# Patient Record
Sex: Female | Born: 1935 | Race: Black or African American | Hispanic: No | Marital: Single | Smoking: Former smoker
Health system: Southern US, Community
[De-identification: ages and names within clinical notes are randomized; demographics above are authoritative.]

## PROBLEM LIST (undated history)

## (undated) DIAGNOSIS — E785 Hyperlipidemia, unspecified: Secondary | ICD-10-CM

## (undated) DIAGNOSIS — E669 Obesity, unspecified: Secondary | ICD-10-CM

## (undated) DIAGNOSIS — I10 Essential (primary) hypertension: Secondary | ICD-10-CM

## (undated) DIAGNOSIS — N289 Disorder of kidney and ureter, unspecified: Secondary | ICD-10-CM

## (undated) DIAGNOSIS — D649 Anemia, unspecified: Secondary | ICD-10-CM

## (undated) DIAGNOSIS — K635 Polyp of colon: Secondary | ICD-10-CM

## (undated) DIAGNOSIS — M1990 Unspecified osteoarthritis, unspecified site: Secondary | ICD-10-CM

## (undated) HISTORY — DX: Polyp of colon: K63.5

## (undated) HISTORY — DX: Essential (primary) hypertension: I10

## (undated) HISTORY — DX: Hyperlipidemia, unspecified: E78.5

## (undated) HISTORY — DX: Disorder of kidney and ureter, unspecified: N28.9

## (undated) HISTORY — PX: THYROIDECTOMY: SHX17

## (undated) HISTORY — PX: EYE SURGERY: SHX253

## (undated) HISTORY — DX: Unspecified osteoarthritis, unspecified site: M19.90

## (undated) HISTORY — DX: Obesity, unspecified: E66.9

## (undated) HISTORY — PX: NEPHRECTOMY: SHX65

## (undated) HISTORY — PX: OTHER SURGICAL HISTORY: SHX169

---

## 1999-09-08 ENCOUNTER — Encounter: Payer: Self-pay | Admitting: Internal Medicine

## 1999-09-08 ENCOUNTER — Encounter: Admission: RE | Admit: 1999-09-08 | Discharge: 1999-09-08 | Payer: Self-pay | Admitting: Internal Medicine

## 2001-09-17 ENCOUNTER — Encounter: Admission: RE | Admit: 2001-09-17 | Discharge: 2001-09-17 | Payer: Self-pay | Admitting: Internal Medicine

## 2001-09-17 ENCOUNTER — Encounter: Payer: Self-pay | Admitting: Internal Medicine

## 2004-09-09 ENCOUNTER — Encounter: Admission: RE | Admit: 2004-09-09 | Discharge: 2004-12-08 | Payer: Self-pay | Admitting: Family Medicine

## 2004-09-15 LAB — HM COLONOSCOPY

## 2004-09-22 ENCOUNTER — Encounter: Admission: RE | Admit: 2004-09-22 | Discharge: 2004-09-22 | Payer: Self-pay | Admitting: Family Medicine

## 2005-12-15 ENCOUNTER — Encounter: Admission: RE | Admit: 2005-12-15 | Discharge: 2005-12-15 | Payer: Self-pay | Admitting: Family Medicine

## 2005-12-22 ENCOUNTER — Ambulatory Visit: Payer: Self-pay | Admitting: Family Medicine

## 2006-02-21 ENCOUNTER — Ambulatory Visit: Payer: Self-pay | Admitting: Family Medicine

## 2006-03-14 ENCOUNTER — Ambulatory Visit: Payer: Self-pay | Admitting: Family Medicine

## 2006-03-27 ENCOUNTER — Ambulatory Visit: Payer: Self-pay | Admitting: Family Medicine

## 2006-07-20 ENCOUNTER — Ambulatory Visit: Payer: Self-pay | Admitting: Family Medicine

## 2006-08-15 ENCOUNTER — Ambulatory Visit: Payer: Self-pay | Admitting: Family Medicine

## 2006-12-05 ENCOUNTER — Ambulatory Visit: Payer: Self-pay | Admitting: Family Medicine

## 2007-01-04 ENCOUNTER — Ambulatory Visit: Payer: Self-pay | Admitting: Family Medicine

## 2007-05-10 ENCOUNTER — Ambulatory Visit: Payer: Self-pay | Admitting: Family Medicine

## 2007-09-03 ENCOUNTER — Ambulatory Visit: Payer: Self-pay | Admitting: Family Medicine

## 2007-09-04 ENCOUNTER — Encounter: Admission: RE | Admit: 2007-09-04 | Discharge: 2007-09-04 | Payer: Self-pay | Admitting: Family Medicine

## 2007-12-13 ENCOUNTER — Ambulatory Visit: Payer: Self-pay | Admitting: Family Medicine

## 2008-02-26 ENCOUNTER — Ambulatory Visit: Payer: Self-pay | Admitting: Family Medicine

## 2008-06-18 ENCOUNTER — Ambulatory Visit: Payer: Self-pay | Admitting: Family Medicine

## 2008-08-08 ENCOUNTER — Ambulatory Visit: Payer: Self-pay | Admitting: Family Medicine

## 2008-09-25 ENCOUNTER — Ambulatory Visit: Payer: Self-pay | Admitting: Family Medicine

## 2008-10-15 ENCOUNTER — Ambulatory Visit: Payer: Self-pay | Admitting: Family Medicine

## 2008-11-25 ENCOUNTER — Ambulatory Visit: Payer: Self-pay | Admitting: Family Medicine

## 2008-11-28 ENCOUNTER — Ambulatory Visit: Payer: Self-pay | Admitting: Family Medicine

## 2009-01-14 ENCOUNTER — Ambulatory Visit: Payer: Self-pay | Admitting: Family Medicine

## 2009-01-20 ENCOUNTER — Ambulatory Visit: Payer: Self-pay | Admitting: Family Medicine

## 2009-05-20 ENCOUNTER — Ambulatory Visit: Payer: Self-pay | Admitting: Family Medicine

## 2009-07-07 ENCOUNTER — Ambulatory Visit: Payer: Self-pay | Admitting: Family Medicine

## 2009-07-09 ENCOUNTER — Ambulatory Visit: Payer: Self-pay | Admitting: Family Medicine

## 2009-11-11 ENCOUNTER — Ambulatory Visit: Payer: Self-pay | Admitting: Family Medicine

## 2009-11-17 ENCOUNTER — Ambulatory Visit: Payer: Self-pay | Admitting: Family Medicine

## 2010-02-10 ENCOUNTER — Ambulatory Visit: Payer: Self-pay | Admitting: Family Medicine

## 2010-05-25 ENCOUNTER — Emergency Department (HOSPITAL_COMMUNITY)
Admission: EM | Admit: 2010-05-25 | Discharge: 2010-05-26 | Disposition: A | Discharge status: Home / Self Care | Payer: Medicare Other | Attending: Emergency Medicine | Admitting: Emergency Medicine

## 2010-05-25 DIAGNOSIS — I10 Essential (primary) hypertension: Secondary | ICD-10-CM | POA: Insufficient documentation

## 2010-05-25 DIAGNOSIS — Z79899 Other long term (current) drug therapy: Secondary | ICD-10-CM | POA: Insufficient documentation

## 2010-05-25 DIAGNOSIS — K5900 Constipation, unspecified: Secondary | ICD-10-CM | POA: Insufficient documentation

## 2010-05-25 DIAGNOSIS — M109 Gout, unspecified: Secondary | ICD-10-CM | POA: Insufficient documentation

## 2010-05-25 DIAGNOSIS — E119 Type 2 diabetes mellitus without complications: Secondary | ICD-10-CM | POA: Insufficient documentation

## 2010-05-25 DIAGNOSIS — Z7982 Long term (current) use of aspirin: Secondary | ICD-10-CM | POA: Insufficient documentation

## 2010-05-26 ENCOUNTER — Emergency Department (HOSPITAL_COMMUNITY): Payer: Medicare Other

## 2010-06-18 ENCOUNTER — Ambulatory Visit (INDEPENDENT_AMBULATORY_CARE_PROVIDER_SITE_OTHER): Payer: Medicare Other | Admitting: Family Medicine

## 2010-06-18 DIAGNOSIS — E119 Type 2 diabetes mellitus without complications: Secondary | ICD-10-CM

## 2010-06-18 DIAGNOSIS — M069 Rheumatoid arthritis, unspecified: Secondary | ICD-10-CM

## 2010-06-18 DIAGNOSIS — I10 Essential (primary) hypertension: Secondary | ICD-10-CM

## 2010-06-18 DIAGNOSIS — M129 Arthropathy, unspecified: Secondary | ICD-10-CM

## 2010-11-30 ENCOUNTER — Encounter: Payer: Self-pay | Admitting: Family Medicine

## 2010-12-01 ENCOUNTER — Ambulatory Visit (INDEPENDENT_AMBULATORY_CARE_PROVIDER_SITE_OTHER): Payer: Medicare Other | Admitting: Family Medicine

## 2010-12-01 ENCOUNTER — Encounter: Payer: Self-pay | Admitting: Family Medicine

## 2010-12-01 DIAGNOSIS — M069 Rheumatoid arthritis, unspecified: Secondary | ICD-10-CM

## 2010-12-01 DIAGNOSIS — E785 Hyperlipidemia, unspecified: Secondary | ICD-10-CM

## 2010-12-01 DIAGNOSIS — E119 Type 2 diabetes mellitus without complications: Secondary | ICD-10-CM | POA: Insufficient documentation

## 2010-12-01 DIAGNOSIS — Z23 Encounter for immunization: Secondary | ICD-10-CM

## 2010-12-01 LAB — LIPID PANEL
HDL: 75 mg/dL (ref >39)
LDL Cholesterol: 82 mg/dL (ref 0–99)
Triglycerides: 53 mg/dL (ref <150)
VLDL: 11 mg/dL (ref 0–40)

## 2010-12-01 NOTE — Progress Notes (Signed)
  Subjective:    Patient ID: Joanne Valdez, female    DOB: 1926-09-06, 77 y.o.   MRN: [id number]  HPI She is here for a recheck. She is scheduled to see an orthopedic surgeon in the near future for possible left knee replacement. Her rheumatoid arthritis is under fairly good control. She has seen some low blood sugars in the morning taking the metformin twice per day. She has had cataract surgery within the last year and is doing much better with this. She has had no gout attacks. She has difficulty with walking and exercising due to the knee pain. She does not smoke or drink.   Review of Systems     Objective:   Physical Exam Alert and in no distress. Hemoglobin A1c is 5.5.      Assessment & Plan:  Diabetes. Rheumatoid arthritis. Obesity. Hyperlipidemia. Hypertension. Gout. Cut back on metformin to one pill per day. She will followup with her orthopedic surgeon. Continue on her other medications. Recheck here in 4 months.

## 2010-12-01 NOTE — Patient Instructions (Signed)
Take the metformin once per day

## 2010-12-02 ENCOUNTER — Telehealth: Payer: Self-pay

## 2010-12-02 NOTE — Telephone Encounter (Signed)
Pt informed of labs and will call when she needs med refilled

## 2010-12-08 ENCOUNTER — Telehealth: Payer: Self-pay | Admitting: Family Medicine

## 2010-12-08 MED ORDER — LISINOPRIL-HYDROCHLOROTHIAZIDE 20-12.5 MG PO TABS: 1.0000 | ORAL_TABLET | Freq: Every day | ORAL | Status: DC

## 2010-12-08 NOTE — Telephone Encounter (Signed)
Sent 30 days to [facility] and 90 days to [facility] per pt call lisinnopril

## 2011-01-06 ENCOUNTER — Other Ambulatory Visit: Payer: Self-pay | Admitting: Family Medicine

## 2011-03-30 DIAGNOSIS — H43399 Other vitreous opacities, unspecified eye: Secondary | ICD-10-CM | POA: Diagnosis not present

## 2011-03-30 DIAGNOSIS — H26499 Other secondary cataract, unspecified eye: Secondary | ICD-10-CM | POA: Diagnosis not present

## 2011-03-30 DIAGNOSIS — E109 Type 1 diabetes mellitus without complications: Secondary | ICD-10-CM | POA: Diagnosis not present

## 2011-03-30 DIAGNOSIS — H40159 Residual stage of open-angle glaucoma, unspecified eye: Secondary | ICD-10-CM | POA: Diagnosis not present

## 2011-04-04 ENCOUNTER — Emergency Department (HOSPITAL_COMMUNITY): Payer: Medicare Other

## 2011-04-04 ENCOUNTER — Encounter (HOSPITAL_COMMUNITY): Payer: Self-pay | Admitting: Emergency Medicine

## 2011-04-04 ENCOUNTER — Emergency Department (HOSPITAL_COMMUNITY)
Admission: EM | Admit: 2011-04-04 | Discharge: 2011-04-04 | Disposition: A | Discharge status: Home / Self Care | Payer: Medicare Other | Attending: Emergency Medicine | Admitting: Emergency Medicine

## 2011-04-04 DIAGNOSIS — E785 Hyperlipidemia, unspecified: Secondary | ICD-10-CM | POA: Insufficient documentation

## 2011-04-04 DIAGNOSIS — IMO0002 Reserved for concepts with insufficient information to code with codable children: Secondary | ICD-10-CM | POA: Diagnosis not present

## 2011-04-04 DIAGNOSIS — Z79899 Other long term (current) drug therapy: Secondary | ICD-10-CM | POA: Diagnosis not present

## 2011-04-04 DIAGNOSIS — Z7982 Long term (current) use of aspirin: Secondary | ICD-10-CM | POA: Insufficient documentation

## 2011-04-04 DIAGNOSIS — T148XXA Other injury of unspecified body region, initial encounter: Secondary | ICD-10-CM | POA: Diagnosis not present

## 2011-04-04 DIAGNOSIS — E119 Type 2 diabetes mellitus without complications: Secondary | ICD-10-CM | POA: Insufficient documentation

## 2011-04-04 DIAGNOSIS — M129 Arthropathy, unspecified: Secondary | ICD-10-CM | POA: Diagnosis not present

## 2011-04-04 DIAGNOSIS — M25569 Pain in unspecified knee: Secondary | ICD-10-CM | POA: Insufficient documentation

## 2011-04-04 DIAGNOSIS — M1990 Unspecified osteoarthritis, unspecified site: Secondary | ICD-10-CM

## 2011-04-04 DIAGNOSIS — Z8639 Personal history of other endocrine, nutritional and metabolic disease: Secondary | ICD-10-CM | POA: Insufficient documentation

## 2011-04-04 DIAGNOSIS — I10 Essential (primary) hypertension: Secondary | ICD-10-CM | POA: Diagnosis not present

## 2011-04-04 DIAGNOSIS — I998 Other disorder of circulatory system: Secondary | ICD-10-CM | POA: Insufficient documentation

## 2011-04-04 DIAGNOSIS — M25469 Effusion, unspecified knee: Secondary | ICD-10-CM | POA: Diagnosis not present

## 2011-04-04 DIAGNOSIS — M25869 Other specified joint disorders, unspecified knee: Secondary | ICD-10-CM | POA: Diagnosis not present

## 2011-04-04 DIAGNOSIS — M259 Joint disorder, unspecified: Secondary | ICD-10-CM | POA: Diagnosis not present

## 2011-04-04 DIAGNOSIS — Z862 Personal history of diseases of the blood and blood-forming organs and certain disorders involving the immune mechanism: Secondary | ICD-10-CM | POA: Insufficient documentation

## 2011-04-04 DIAGNOSIS — M1710 Unilateral primary osteoarthritis, unspecified knee: Secondary | ICD-10-CM | POA: Insufficient documentation

## 2011-04-04 MED ORDER — HYDROCODONE-ACETAMINOPHEN 5-325 MG PO TABS: 2.0000 | ORAL_TABLET | ORAL | Status: AC | PRN

## 2011-04-04 MED ORDER — OXYCODONE-ACETAMINOPHEN 5-325 MG PO TABS
1.0000 | ORAL_TABLET | Freq: Once | ORAL | Status: AC
  Administered 2011-04-04: 1 via ORAL
  Filled 2011-04-04: qty 1

## 2011-04-04 NOTE — ED Provider Notes (Signed)
History     CSN: [id number]  Arrival date & time 04/04/11  4098   First MD Initiated Contact with Patient 04/04/11 [phone]      Chief Complaint  Patient presents with  . Knee Pain    (Consider location/radiation/quality/duration/timing/severity/associated sxs/prior treatment) HPI  Pt has a history of severe arthritis in her left knee for which she is scheduled to have a knee replacement in April of 2013. She states that she has some bruising and can't put weight on it. She denies any recent fall or injury that she can remember. She denies having fevers, chills, nausea, vomiting, generalized weakness, or back pain. Otherwise, patient states she "feels pretty good".  Past Medical History  Diagnosis Date  . Diabetes mellitus   . Obesity   . Hypertension   . Gout   . Dyslipidemia   . Renal insufficiency   . Arthritis     RHEUMATOID  . Glaucoma   . Colonic polyp     Past Surgical History  Procedure Date  . Eye surgery     History reviewed. No pertinent family history.  History  Substance Use Topics  . Smoking status: Never Smoker   . Smokeless tobacco: Never Used  . Alcohol Use: Not on file    OB History    Grav Para Term Preterm Abortions TAB SAB Ect Mult Living                  Review of Systems  All other systems reviewed and are negative.    Allergies  Phenobarbital  Home Medications   Current Outpatient Rx  Name Route Sig Dispense Refill  . ALLOPURINOL 300 MG PO TABS Oral Take 300 mg by mouth daily.      Marland Kitchen AMLODIPINE BESYLATE 10 MG PO TABS Oral Take 10 mg by mouth daily.      . ASPIRIN 81 MG PO CHEW Oral Chew 81 mg by mouth daily.      . ATORVASTATIN CALCIUM 40 MG PO TABS Oral Take 40 mg by mouth daily.     Marland Kitchen VITAMIN D3 1000 UNITS PO CAPS Oral Take 1,000 mg by mouth daily.     Marland Kitchen FERROUS SULFATE 325 (65 FE) MG PO TABS Oral Take 325 mg by mouth daily with breakfast.      . FOLIC ACID 1 MG PO TABS Oral Take 1 mg by mouth daily.     Marland Kitchen  LISINOPRIL-HYDROCHLOROTHIAZIDE 20-12.5 MG PO TABS Oral Take 1 tablet by mouth daily. 90 tablet 1  . METFORMIN HCL ER (MOD) 500 MG PO TB24 Oral Take 500 mg by mouth 2 (two) times daily with a meal.     . METHOTREXATE (ANTI-RHEUMATIC) 2.5 MG PO TABS Oral Take 10 mg by mouth once a week. Take on Tuesdays    . ADULT MULTIVITAMIN W/MINERALS CH Oral Take 1 tablet by mouth daily.      Marland Kitchen PREDNISONE 5 MG PO TABS Oral Take 5 mg by mouth daily.     Marland Kitchen HYDROCODONE-ACETAMINOPHEN 5-325 MG PO TABS Oral Take 2 tablets by mouth every 4 (four) hours as needed for pain. 6 tablet 0    BP 95/52  Pulse 115  Temp(Src) 97.7 F (36.5 C) (Oral)  Resp 18  SpO2 100%  Physical Exam  Constitutional: She is oriented to person, place, and time. She appears well-developed and well-nourished.  HENT:  Head: Normocephalic and atraumatic.  Eyes: Conjunctivae are normal. Pupils are equal, round, and reactive to light.  Neck: Trachea  normal, normal range of motion and full passive range of motion without pain. Neck supple.  Cardiovascular: Normal rate and normal pulses.   Pulmonary/Chest: Effort normal. Chest wall is not dull to percussion. She exhibits no tenderness, no crepitus, no edema, no deformity and no retraction.  Abdominal: Soft. Normal appearance.  Musculoskeletal: Normal range of motion.       Legs: Lymphadenopathy:       Head (right side): No submental, no submandibular, no tonsillar, no preauricular, no posterior auricular and no occipital adenopathy present.       Head (left side): No submental, no submandibular, no tonsillar, no preauricular, no posterior auricular and no occipital adenopathy present.    She has no cervical adenopathy.    She has no axillary adenopathy.  Neurological: She is oriented to person, place, and time. She has normal strength.  Skin: Skin is warm, dry and intact.  Psychiatric: Her speech is normal. Cognition and memory are normal.    ED Course  Procedures (including critical  care time)  Labs Reviewed - No data to display Dg Knee Complete 4 Views Left  04/04/2011  *RADIOLOGY REPORT*  Clinical Data: Pain.  Bruising.  LEFT KNEE - COMPLETE 4+ VIEW  Comparison: 01/06/2010  Findings: There is a small joint effusion.  There is tricompartmental osteoarthritis with joint space narrowing and marginal osteophytes.  No evidence of fracture or other acute bony finding.  IMPRESSION: Joint effusion.  Tricompartmental osteoarthritis.  Findings have progressed since the previous study.  Original Report Authenticated By: Thomasenia Sales, M.D.     1. Osteoarthritis       MDM  Pt examined by Doctor Jeraldine Loots and myself. He agrees with management and tx/plan.        Dorthula Matas, PA 04/04/11 1024

## 2011-04-04 NOTE — ED Notes (Signed)
Reports saw ortho MD 2 weeks ago & received an injection with some improvement but pain returned. Denies injury

## 2011-04-04 NOTE — ED Notes (Signed)
Pt c/o left knee pain x 2 days; pt denies obvious injury but sts hx OA in knee; pt sts some swelling

## 2011-04-04 NOTE — ED Provider Notes (Signed)
Medical screening examination/treatment/procedure(s) were conducted as a shared visit with non-physician practitioner(s) and myself.  I personally evaluated the patient during the encounter Elderly F in no distress, though uncomfortable w ROM of L knee.  XR(reviewed by me) c/w djd. The need for expedited f/u w ortho was d/w the patient and her daughter.  Analgesia provided.  Gerhard Munch, MD 04/04/11 [phone]

## 2011-04-08 ENCOUNTER — Ambulatory Visit (INDEPENDENT_AMBULATORY_CARE_PROVIDER_SITE_OTHER): Payer: Medicare Other | Admitting: Family Medicine

## 2011-04-08 ENCOUNTER — Encounter: Payer: Self-pay | Admitting: Family Medicine

## 2011-04-08 DIAGNOSIS — S8000XA Contusion of unspecified knee, initial encounter: Secondary | ICD-10-CM | POA: Diagnosis not present

## 2011-04-08 DIAGNOSIS — E119 Type 2 diabetes mellitus without complications: Secondary | ICD-10-CM

## 2011-04-08 DIAGNOSIS — M069 Rheumatoid arthritis, unspecified: Secondary | ICD-10-CM | POA: Diagnosis not present

## 2011-04-08 DIAGNOSIS — R000 Tachycardia, unspecified: Secondary | ICD-10-CM

## 2011-04-08 DIAGNOSIS — M1710 Unilateral primary osteoarthritis, unspecified knee: Secondary | ICD-10-CM

## 2011-04-08 DIAGNOSIS — S8002XA Contusion of left knee, initial encounter: Secondary | ICD-10-CM

## 2011-04-08 NOTE — Patient Instructions (Signed)
First use Tylenol for pain relief and then add Advil 4 tablets 3 times per day if the Tylenol does not work. If that still does not work, call me.

## 2011-04-08 NOTE — Progress Notes (Signed)
  Subjective:    Patient ID: Joanne Valdez, female    DOB: 09-Oct-1926, 78 y.o.   MRN: [id number]  HPI She is here for a preoperative evaluation. She is scheduled for total left knee replacement. She does have some discoloration to the lateral aspect of her left knee area distally. She does not relate any recent injury. She was seen in the emergency room for this. They did give her Percocet. She continues to complain of knee pain. She has had no chest pain, shortness of breath, cough. She continues on medications listed in the chart. She has no other concerns or complaints.   Review of Systems     Objective:   Physical Exam alert and in no distress. Tympanic membranes and canals are normal. Throat is clear. Tonsils are normal. Neck is supple without adenopathy or thyromegaly. Cardiac exam shows a tachycardia without murmurs or gallops. Lungs are clear to auscultation. EKG shows no acute changes.       Assessment & Plan:   1. Diabetes mellitus  CBC with Differential, Comprehensive metabolic panel  2. Rheumatoid arthritis    3. Degenerative arthritis of knee    4. Tachycardia  PR ELECTROCARDIOGRAM, COMPLETE, Ambulatory referral to Cardiology  5. Contusion of left knee  CBC with Differential, Comprehensive metabolic panel   since she is scheduled for surgery, a cardiology evaluation concerning her tachycardia is appropriate.

## 2011-04-09 LAB — COMPREHENSIVE METABOLIC PANEL
Alkaline Phosphatase: 70 U/L (ref 39–117)
BUN: 40 mg/dL — ABNORMAL HIGH (ref 6–23)
Glucose, Bld: 139 mg/dL — ABNORMAL HIGH (ref 70–99)
Sodium: 137 mEq/L (ref 135–145)
Total Bilirubin: 0.6 mg/dL (ref 0.3–1.2)
Total Protein: 6 g/dL (ref 6.0–8.3)

## 2011-04-09 LAB — CBC WITH DIFFERENTIAL/PLATELET
Basophils Relative: 0 % (ref 0–1)
Eosinophils Absolute: 0.2 10*3/uL (ref 0.0–0.7)
Hemoglobin: 9.6 g/dL — ABNORMAL LOW (ref 12.0–15.0)
MCH: 31.2 pg (ref 26.0–34.0)
MCHC: 31.8 g/dL (ref 30.0–36.0)
Monocytes Relative: 9 % (ref 3–12)
Neutrophils Relative %: 75 % (ref 43–77)

## 2011-04-11 ENCOUNTER — Other Ambulatory Visit: Payer: Self-pay | Admitting: Family Medicine

## 2011-04-14 ENCOUNTER — Ambulatory Visit (INDEPENDENT_AMBULATORY_CARE_PROVIDER_SITE_OTHER): Payer: Medicare Other | Admitting: Family Medicine

## 2011-04-14 ENCOUNTER — Encounter: Payer: Self-pay | Admitting: Family Medicine

## 2011-04-14 VITALS — BP 120/70 | HR 100

## 2011-04-14 DIAGNOSIS — D649 Anemia, unspecified: Secondary | ICD-10-CM | POA: Diagnosis not present

## 2011-04-14 DIAGNOSIS — S8000XA Contusion of unspecified knee, initial encounter: Secondary | ICD-10-CM | POA: Diagnosis not present

## 2011-04-14 DIAGNOSIS — E119 Type 2 diabetes mellitus without complications: Secondary | ICD-10-CM | POA: Diagnosis not present

## 2011-04-14 NOTE — Progress Notes (Signed)
  Subjective:    Patient ID: Joanne Valdez, female    DOB: 09-28-1926, 78 y.o.   MRN: [id number]  HPI She is here for followup visit. Recent blood work did show an anemia. She has a history of having difficulty with anemia in the past and has been on iron supplementation however she has decreased this recently. She also relates a history of having one kidney removed in 1980 due to nonfunctioning. She was recently seen by her dermatologist for reevaluation of her rheumatoid arthritis. She also has a previous history of PMR. Her last colonoscopy was in 2009.   Review of Systems     Objective:   Physical Exam Alert and in no distress otherwise not examined       Assessment & Plan:  Anemia. Blood work will be ordered. She will also be given stool cards.

## 2011-04-15 ENCOUNTER — Telehealth: Payer: Self-pay

## 2011-04-15 LAB — IRON AND TIBC
%SAT: 13 % — ABNORMAL LOW (ref 20–55)
Iron: 28 ug/dL — ABNORMAL LOW (ref 42–145)
TIBC: 212 ug/dL — ABNORMAL LOW (ref 250–470)
UIBC: 184 ug/dL (ref 125–400)

## 2011-04-15 LAB — PTH, INTACT AND CALCIUM: PTH: 45.2 pg/mL (ref 14.0–72.0)

## 2011-04-15 NOTE — Telephone Encounter (Signed)
Joanne Valdez would like to know does she need to cancel her surgery on the 29th or what please advise

## 2011-04-18 ENCOUNTER — Encounter (HOSPITAL_COMMUNITY): Payer: Self-pay | Admitting: Pharmacy Technician

## 2011-04-18 NOTE — Telephone Encounter (Signed)
Pt informed

## 2011-04-18 NOTE — Telephone Encounter (Signed)
Dr.Olin said he believed it would be fine at worse she would need blood transfusion but not likely.

## 2011-04-18 NOTE — Telephone Encounter (Signed)
NURSE CORTNY SAID SHE SENT MESSAGE TO DR. STILL WAITING TO HERE FROM HIM WILL CALL AS SOON AS SHE HEARS SOMETHING FROM DR.OLIN

## 2011-04-18 NOTE — Telephone Encounter (Signed)
Let her know to keep the appointment for the surgery. We enforced the need for her to double up on her iron. Get me the paperwork so I can sign a consent to Dr.Olin

## 2011-04-20 ENCOUNTER — Encounter: Payer: Self-pay | Admitting: Cardiovascular Disease

## 2011-04-20 ENCOUNTER — Ambulatory Visit (INDEPENDENT_AMBULATORY_CARE_PROVIDER_SITE_OTHER): Payer: Medicare Other | Admitting: Cardiovascular Disease

## 2011-04-20 ENCOUNTER — Encounter: Payer: Self-pay | Admitting: Family Medicine

## 2011-04-20 DIAGNOSIS — E785 Hyperlipidemia, unspecified: Secondary | ICD-10-CM

## 2011-04-20 DIAGNOSIS — D649 Anemia, unspecified: Secondary | ICD-10-CM

## 2011-04-20 DIAGNOSIS — E119 Type 2 diabetes mellitus without complications: Secondary | ICD-10-CM

## 2011-04-20 DIAGNOSIS — R000 Tachycardia, unspecified: Secondary | ICD-10-CM | POA: Diagnosis not present

## 2011-04-20 DIAGNOSIS — Z01818 Encounter for other preprocedural examination: Secondary | ICD-10-CM

## 2011-04-20 DIAGNOSIS — M069 Rheumatoid arthritis, unspecified: Secondary | ICD-10-CM | POA: Diagnosis not present

## 2011-04-20 LAB — BASIC METABOLIC PANEL
BUN: 25 mg/dL — ABNORMAL HIGH (ref 6–23)
CO2: 26 mEq/L (ref 19–32)
Calcium: 11.5 mg/dL — ABNORMAL HIGH (ref 8.4–10.5)
Creatinine, Ser: 1.3 mg/dL — ABNORMAL HIGH (ref 0.4–1.2)
Glucose, Bld: 121 mg/dL — ABNORMAL HIGH (ref 70–99)

## 2011-04-20 MED ORDER — METOPROLOL SUCCINATE ER 50 MG PO TB24: 50.0000 mg | ORAL_TABLET | Freq: Every day | ORAL | Status: DC

## 2011-04-20 NOTE — Patient Instructions (Signed)
Your physician recommends that you schedule a follow-up appointment in: 3 WEEKS WITH DR [facility] Your physician has recommended you make the following change in your medication: ADD TOPROL 50 MG 1 EVERY DAY  Your physician recommends that you return for lab work in: TODAY  BMET FREE T4 TSH  DX TACHYCARDIA Your physician has requested that you have an echocardiogram. Echocardiography is a painless test that uses sound waves to create images of your heart. It provides your doctor with information about the size and shape of your heart and how well your heart's chambers and valves are working. This procedure takes approximately one hour. There are no restrictions for this procedure.  TOMORROW OR FRI  DX PRE OP CLEARANCE  TACHYCARDIA

## 2011-04-20 NOTE — Assessment & Plan Note (Signed)
Stable Hct around 30  S/P nephrectomy  Dont think this is only thing driving HR

## 2011-04-20 NOTE — Assessment & Plan Note (Signed)
Discussed low carb diet.  Target hemoglobin A1c is 6.5 or less.  Continue current medications.  

## 2011-04-20 NOTE — Assessment & Plan Note (Signed)
Not clear that it is pathological.  She indicates it has been high for years.  CXR and ECho  TSH/T4.  Start Toprol preop.  If HR improved and other tests normal will try to clear for 1/29

## 2011-04-20 NOTE — Assessment & Plan Note (Signed)
Continue NSAI's  Needs L TKR if cleared Spoke with Dr Boneta Lucks office

## 2011-04-20 NOTE — Assessment & Plan Note (Signed)
Cholesterol is at goal.  Continue current dose of statin and diet Rx.  No myalgias or side effects.  F/U  LFT's in 6 months. Lab Results  Component Value Date   LDLCALC 82 12/01/2010             

## 2011-04-20 NOTE — Progress Notes (Signed)
78 yo referred for preop cleanence.  Needs left TKR Dr Charlann Boxer scheduled for 1/29.  Seen by primary Susann Givens.  Has always had high HR.  Anemic with Hct about 30.  Negative w/u including colonoscopy.  Previous right kidney removal in 80;s.  No previous cardiac history.  Activity limited by knee pain not dyspnea or SSCP Mild LE edema with varicosities LLE worse than right.  Had 3/4 of her thyroid removed a long time ago.  No recent TSH/T4 in Epic.  No previous anesthetic complications or bleeding problems.  No chronic lung disease, CAD, or arrythmia other than tachycardia  ROS: Denies fever, malais, weight loss, blurry vision, decreased visual acuity, cough, sputum, SOB, hemoptysis, pleuritic pain, palpitaitons, heartburn, abdominal pain, melena, lower extremity edema, claudication, or rash.  All other systems reviewed and negative   General: Affect appropriate Overweight black female in wheel chair HEENT: normal Neck supple with no adenopathy JVP normal no bruits no thyromegaly Lungs clear with no wheezing and good diaphragmatic motion Heart:  S1/S2 no murmur,rub, gallop or click PMI normal Abdomen: benighn, BS positve, no tenderness, no AAA S/P right nephrectomy no bruit.  No HSM or HJR Distal pulses intact with no bruits Plus one bilateral edema with varicosities Neuro non-focal Skin warm and dry No muscular weakness  Medications Current Outpatient Prescriptions  Medication Sig Dispense Refill  . allopurinol (ZYLOPRIM) 300 MG tablet Take 300 mg by mouth daily.        Marland Kitchen amLODipine (NORVASC) 10 MG tablet Take 10 mg by mouth daily with breakfast.       . aspirin 81 MG chewable tablet Chew 81 mg by mouth daily with breakfast.       . atorvastatin (LIPITOR) 40 MG tablet Take 40 mg by mouth daily with breakfast.       . Cholecalciferol (VITAMIN D3) 1000 UNITS CAPS Take 1,000 mg by mouth daily.       . ferrous sulfate 325 (65 FE) MG tablet Take 325 mg by mouth 2 (two) times daily.       . folic  acid (FOLVITE) 1 MG tablet Take 1 mg by mouth daily.       Marland Kitchen lisinopril-hydrochlorothiazide (PRINZIDE,ZESTORETIC) 20-12.5 MG per tablet Take 1 tablet by mouth daily with breakfast.      . LUMIGAN 0.01 % SOLN Place 1 drop into both eyes at bedtime.       . metFORMIN (GLUCOPHAGE) 500 MG tablet Take 500 mg by mouth daily with breakfast.      . methotrexate (RHEUMATREX) 2.5 MG tablet Take 10 mg by mouth once a week. Take on Tuesdays      . Multiple Vitamin (MULITIVITAMIN WITH MINERALS) TABS Take 1 tablet by mouth daily.        . predniSONE (DELTASONE) 5 MG tablet Take 5 mg by mouth daily.         Allergies Phenobarbital  Family History: No family history on file.  Social History: History   Social History  . Marital Status: Single    Spouse Name: N/A    Number of Children: N/A  . Years of Education: N/A   Occupational History  . Not on file.   Social History Main Topics  . Smoking status: Never Smoker   . Smokeless tobacco: Never Used  . Alcohol Use: Not on file  . Drug Use: Not on file  . Sexually Active: Not on file   Other Topics Concern  . Not on file   Social History Narrative  .  No narrative on file    Electrocardiogram:  Sinus tachycardia rate 125 otherwise normal  Assessment and Plan

## 2011-04-21 ENCOUNTER — Ambulatory Visit (HOSPITAL_COMMUNITY): Payer: Medicare Other | Attending: Cardiology

## 2011-04-21 DIAGNOSIS — Z01810 Encounter for preprocedural cardiovascular examination: Secondary | ICD-10-CM | POA: Diagnosis not present

## 2011-04-21 DIAGNOSIS — E119 Type 2 diabetes mellitus without complications: Secondary | ICD-10-CM | POA: Diagnosis not present

## 2011-04-21 DIAGNOSIS — R609 Edema, unspecified: Secondary | ICD-10-CM | POA: Diagnosis not present

## 2011-04-21 DIAGNOSIS — R000 Tachycardia, unspecified: Secondary | ICD-10-CM

## 2011-04-21 DIAGNOSIS — Z01818 Encounter for other preprocedural examination: Secondary | ICD-10-CM

## 2011-04-21 DIAGNOSIS — E785 Hyperlipidemia, unspecified: Secondary | ICD-10-CM | POA: Diagnosis not present

## 2011-04-21 DIAGNOSIS — M069 Rheumatoid arthritis, unspecified: Secondary | ICD-10-CM | POA: Insufficient documentation

## 2011-04-21 DIAGNOSIS — M25569 Pain in unspecified knee: Secondary | ICD-10-CM | POA: Diagnosis not present

## 2011-04-21 LAB — T4, FREE: Free T4: 1.14 ng/dL (ref 0.60–1.60)

## 2011-04-22 ENCOUNTER — Encounter (HOSPITAL_COMMUNITY): Payer: Self-pay | Admitting: *Deleted

## 2011-04-22 ENCOUNTER — Inpatient Hospital Stay (HOSPITAL_COMMUNITY): Admission: RE | Admit: 2011-04-22 | Discharge: 2011-04-22 | Payer: Medicare Other | Source: Ambulatory Visit

## 2011-04-22 ENCOUNTER — Encounter: Payer: Self-pay | Admitting: *Deleted

## 2011-04-22 NOTE — Pre-Procedure Instructions (Signed)
04/21/11 ECHO in [facility]  04/20/11 EKG in [facility]  05/26/10 CXR in [facility]  Office visit with Dr Eden Emms in [facility] 04/20/11  Pt instructed to take shower with hibiclens nite before and am of surgery.  Shower chin to toes.  Wash face and private parts with regular soap.   Clear liquids until 1100am the morning of surgery.  Clear liquid sheet reviewed with pt.  Pt made aware that due to surgery is so late in day to watch on glucose and report to [facility] if glucose starts to fall.

## 2011-04-22 NOTE — H&P (Signed)
Joanne Valdez is an 78 y.o. female.    Chief Complaint: left knee OA and pain   HPI: Pt is a 78 y.o. female complaining of left knee pain for a couple of years. Pain had continually increased since the beginning. X-rays in the clinic show end-stage arthritic changes of the left knee, with some bone-on-bone changes. Pt has tried various conservative treatments which have failed to alleviate their symptoms, including NSAIDs and steroid injections. Various options are discussed with the patient. Risks, benefits and expectations were discussed with the patient. Patient understand the risks, benefits and expectations and wishes to proceed with surgery. Pt has been seeing Dr. Eden Emms whom has to have a final clearance for the pt, once cleared we will proceed with the surgery.  PCP:  Carollee Herter, MD, MD  D/C Plans:  Home with HHPT  Post-op Meds:  Rx given for ASA, Robaxin, Iron, Colace and MiraLax  Tranexamic Acid:  Not to be given  PMH: Past Medical History  Diagnosis Date  . Diabetes mellitus   . Obesity   . Hypertension   . Gout   . Dyslipidemia   . Renal insufficiency   . Arthritis     RHEUMATOID  . Glaucoma   . Colonic polyp     PSH: Past Surgical History  Procedure Date  . Eye surgery     Social History:  reports that she has never smoked. She has never used smokeless tobacco. Her alcohol and drug histories not on file.  Allergies:  Allergies  Allergen Reactions  . Phenobarbital Hives    Medications: No current facility-administered medications for this encounter.   Current Outpatient Prescriptions  Medication Sig Dispense Refill  . allopurinol (ZYLOPRIM) 300 MG tablet Take 300 mg by mouth daily.        Marland Kitchen amLODipine (NORVASC) 10 MG tablet Take 10 mg by mouth daily with breakfast.       . aspirin 81 MG chewable tablet Chew 81 mg by mouth daily with breakfast.       . atorvastatin (LIPITOR) 40 MG tablet Take 40 mg by mouth daily with breakfast.       .  Cholecalciferol (VITAMIN D3) 1000 UNITS CAPS Take 1,000 mg by mouth daily.       . ferrous sulfate 325 (65 FE) MG tablet Take 325 mg by mouth 2 (two) times daily.       . folic acid (FOLVITE) 1 MG tablet Take 1 mg by mouth daily.       Marland Kitchen lisinopril-hydrochlorothiazide (PRINZIDE,ZESTORETIC) 20-12.5 MG per tablet Take 1 tablet by mouth daily with breakfast.      . LUMIGAN 0.01 % SOLN Place 1 drop into both eyes at bedtime.       . metFORMIN (GLUCOPHAGE) 500 MG tablet Take 500 mg by mouth daily with breakfast.      . methotrexate (RHEUMATREX) 2.5 MG tablet Take 10 mg by mouth once a week. Take on Tuesdays      . metoprolol succinate (TOPROL-XL) 50 MG 24 hr tablet Take 1 tablet (50 mg total) by mouth daily. Take with or immediately following a meal.  30 tablet  11  . Multiple Vitamin (MULITIVITAMIN WITH MINERALS) TABS Take 1 tablet by mouth daily.        . predniSONE (DELTASONE) 5 MG tablet Take 5 mg by mouth daily.         Results for orders placed in visit on 04/20/11 (from the past 48 hour(s))  TSH  Status: Normal   Collection Time   04/20/11  2:45 PM      Component Value Range Comment   TSH 0.78  0.35 - 5.50 (uIU/mL)   T4, FREE     Status: Normal   Collection Time   04/20/11  2:45 PM      Component Value Range Comment   Free T4 1.14  0.60 - 1.60 (ng/dL)   BASIC METABOLIC PANEL     Status: Abnormal   Collection Time   04/20/11  2:45 PM      Component Value Range Comment   Sodium 138  135 - 145 (mEq/L)    Potassium 3.9  3.5 - 5.1 (mEq/L)    Chloride 104  96 - 112 (mEq/L)    CO2 26  19 - 32 (mEq/L)    Glucose, Bld 121 (*) 70 - 99 (mg/dL)    BUN 25 (*) 6 - 23 (mg/dL)    Creatinine, Ser 1.3 (*) 0.4 - 1.2 (mg/dL)    Calcium 81.1 (*) 8.4 - 10.5 (mg/dL)    GFR 91.47 (*) >82.95 (mL/min)      ROS: Review of Systems  Constitutional: Negative.   HENT: Negative.  Negative for neck pain.   Eyes: Negative.   Respiratory: Negative.   Cardiovascular: Negative.   Gastrointestinal:  Negative.   Genitourinary: Negative.   Musculoskeletal: Positive for myalgias and joint pain. Negative for back pain and falls.  Skin: Negative.   Neurological: Negative.   Endo/Heme/Allergies: Negative.   Psychiatric/Behavioral: Negative.      Physican Exam: BP: 140/78 ; HR: 124 ; Resp: 18 Physical Exam  Constitutional: She is oriented to person, place, and time and well-developed, well-nourished, and in no distress.  HENT:  Head: Normocephalic and atraumatic.  Nose: Nose normal.  Mouth/Throat: Oropharynx is clear and moist.  Eyes: Pupils are equal, round, and reactive to light.  Neck: Neck supple. No JVD present. No tracheal deviation present. No thyromegaly present.  Cardiovascular: Normal rate, regular rhythm, normal heart sounds and intact distal pulses.   Pulmonary/Chest: Effort normal and breath sounds normal. No stridor. No respiratory distress. She has no wheezes. She has no rales. She exhibits no tenderness.  Abdominal: Soft. There is no tenderness. There is no guarding.  Musculoskeletal:       Left knee: She exhibits decreased range of motion (10-80), swelling and bony tenderness. She exhibits no effusion, no ecchymosis, no deformity, no laceration and no erythema. tenderness found.  Lymphadenopathy:    She has no cervical adenopathy.  Neurological: She is alert and oriented to person, place, and time.  Skin: Skin is warm and dry. No rash noted. No erythema. No pallor.  Psychiatric: Affect normal.     Assessment/Plan Assessment: left knee OA and pain   Plan: Patient will undergo a left total knee arthroplasty on 04/26/2011. Risks benefits and expectation were discussed with the patient. Patient understand risks, benefits and expectation and wishes to proceed.   Anastasio Auerbach Tariyah Pendry   PAC  04/22/2011, 12:20 PM

## 2011-04-26 ENCOUNTER — Encounter (HOSPITAL_COMMUNITY): Payer: Self-pay | Admitting: *Deleted

## 2011-04-26 ENCOUNTER — Encounter (HOSPITAL_COMMUNITY): Payer: Self-pay | Admitting: Certified Registered Nurse Anesthetist

## 2011-04-26 ENCOUNTER — Inpatient Hospital Stay (HOSPITAL_COMMUNITY)
Admission: RE | Admit: 2011-04-26 | Discharge: 2011-04-29 | DRG: 470 | Disposition: A | Discharge status: Skilled Nursing Facility | Payer: Medicare Other | Source: Ambulatory Visit | Attending: Orthopedic Surgery | Admitting: Orthopedic Surgery

## 2011-04-26 ENCOUNTER — Inpatient Hospital Stay (HOSPITAL_COMMUNITY): Payer: Medicare Other | Admitting: Certified Registered Nurse Anesthetist

## 2011-04-26 ENCOUNTER — Encounter (HOSPITAL_COMMUNITY)
Admission: RE | Disposition: A | Discharge status: Skilled Nursing Facility | Payer: Self-pay | Source: Ambulatory Visit | Attending: Orthopedic Surgery

## 2011-04-26 DIAGNOSIS — Z4801 Encounter for change or removal of surgical wound dressing: Secondary | ICD-10-CM | POA: Diagnosis not present

## 2011-04-26 DIAGNOSIS — D649 Anemia, unspecified: Secondary | ICD-10-CM | POA: Diagnosis not present

## 2011-04-26 DIAGNOSIS — F29 Unspecified psychosis not due to a substance or known physiological condition: Secondary | ICD-10-CM | POA: Diagnosis not present

## 2011-04-26 DIAGNOSIS — N289 Disorder of kidney and ureter, unspecified: Secondary | ICD-10-CM | POA: Diagnosis present

## 2011-04-26 DIAGNOSIS — M1710 Unilateral primary osteoarthritis, unspecified knee: Secondary | ICD-10-CM | POA: Diagnosis not present

## 2011-04-26 DIAGNOSIS — D126 Benign neoplasm of colon, unspecified: Secondary | ICD-10-CM | POA: Diagnosis not present

## 2011-04-26 DIAGNOSIS — I10 Essential (primary) hypertension: Secondary | ICD-10-CM | POA: Diagnosis present

## 2011-04-26 DIAGNOSIS — R279 Unspecified lack of coordination: Secondary | ICD-10-CM | POA: Diagnosis not present

## 2011-04-26 DIAGNOSIS — E785 Hyperlipidemia, unspecified: Secondary | ICD-10-CM | POA: Diagnosis present

## 2011-04-26 DIAGNOSIS — M6281 Muscle weakness (generalized): Secondary | ICD-10-CM | POA: Diagnosis not present

## 2011-04-26 DIAGNOSIS — M069 Rheumatoid arthritis, unspecified: Secondary | ICD-10-CM | POA: Diagnosis present

## 2011-04-26 DIAGNOSIS — E119 Type 2 diabetes mellitus without complications: Secondary | ICD-10-CM | POA: Diagnosis present

## 2011-04-26 DIAGNOSIS — H409 Unspecified glaucoma: Secondary | ICD-10-CM | POA: Diagnosis present

## 2011-04-26 DIAGNOSIS — E669 Obesity, unspecified: Secondary | ICD-10-CM | POA: Diagnosis present

## 2011-04-26 DIAGNOSIS — Z79899 Other long term (current) drug therapy: Secondary | ICD-10-CM | POA: Diagnosis not present

## 2011-04-26 DIAGNOSIS — M109 Gout, unspecified: Secondary | ICD-10-CM | POA: Diagnosis not present

## 2011-04-26 DIAGNOSIS — R262 Difficulty in walking, not elsewhere classified: Secondary | ICD-10-CM | POA: Diagnosis not present

## 2011-04-26 DIAGNOSIS — Z7982 Long term (current) use of aspirin: Secondary | ICD-10-CM | POA: Diagnosis not present

## 2011-04-26 DIAGNOSIS — S8990XA Unspecified injury of unspecified lower leg, initial encounter: Secondary | ICD-10-CM | POA: Diagnosis not present

## 2011-04-26 DIAGNOSIS — IMO0002 Reserved for concepts with insufficient information to code with codable children: Secondary | ICD-10-CM | POA: Diagnosis not present

## 2011-04-26 DIAGNOSIS — Z5189 Encounter for other specified aftercare: Secondary | ICD-10-CM | POA: Diagnosis not present

## 2011-04-26 DIAGNOSIS — Z471 Aftercare following joint replacement surgery: Secondary | ICD-10-CM | POA: Diagnosis not present

## 2011-04-26 DIAGNOSIS — Z96659 Presence of unspecified artificial knee joint: Secondary | ICD-10-CM | POA: Diagnosis not present

## 2011-04-26 DIAGNOSIS — D62 Acute posthemorrhagic anemia: Secondary | ICD-10-CM

## 2011-04-26 DIAGNOSIS — M25569 Pain in unspecified knee: Secondary | ICD-10-CM | POA: Diagnosis not present

## 2011-04-26 HISTORY — PX: TOTAL KNEE ARTHROPLASTY: SHX125

## 2011-04-26 HISTORY — DX: Anemia, unspecified: D64.9

## 2011-04-26 LAB — APTT: aPTT: 29 seconds (ref 24–37)

## 2011-04-26 LAB — DIFFERENTIAL
Basophils Absolute: 0 10*3/uL (ref 0.0–0.1)
Eosinophils Relative: 4 % (ref 0–5)
Lymphocytes Relative: 21 % (ref 12–46)
Monocytes Relative: 11 % (ref 3–12)
Neutro Abs: 4.8 10*3/uL (ref 1.7–7.7)
Smear Review: DECREASED

## 2011-04-26 LAB — URINALYSIS, ROUTINE W REFLEX MICROSCOPIC
Ketones, ur: NEGATIVE mg/dL
Nitrite: NEGATIVE
Protein, ur: NEGATIVE mg/dL
Urobilinogen, UA: 0.2 mg/dL (ref 0.0–1.0)

## 2011-04-26 LAB — PROTIME-INR: Prothrombin Time: 14.2 seconds (ref 11.6–15.2)

## 2011-04-26 LAB — CBC
HCT: 29.1 % — ABNORMAL LOW (ref 36.0–46.0)
Platelets: 47 10*3/uL — ABNORMAL LOW (ref 150–400)
RBC: 3.07 MIL/uL — ABNORMAL LOW (ref 3.87–5.11)
RDW: 16.2 % — ABNORMAL HIGH (ref 11.5–15.5)
WBC: 7.5 10*3/uL (ref 4.0–10.5)

## 2011-04-26 LAB — BASIC METABOLIC PANEL
Chloride: 109 mEq/L (ref 96–112)
GFR calc Af Amer: 42 mL/min — ABNORMAL LOW (ref >90)
Potassium: 4.1 mEq/L (ref 3.5–5.1)

## 2011-04-26 LAB — URINE MICROSCOPIC-ADD ON

## 2011-04-26 LAB — GLUCOSE, CAPILLARY
Glucose-Capillary: 75 mg/dL (ref 70–99)
Glucose-Capillary: 122 mg/dL — ABNORMAL HIGH (ref 70–99)

## 2011-04-26 LAB — ABO/RH: ABO/RH(D): A POS

## 2011-04-26 SURGERY — ARTHROPLASTY, KNEE, TOTAL
Anesthesia: General | Site: Knee | Laterality: Left | Wound class: Clean

## 2011-04-26 MED ORDER — HYDROMORPHONE HCL PF 1 MG/ML IJ SOLN
INTRAMUSCULAR | Status: DC | PRN
  Administered 2011-04-26 (×3): 0.5 mg via INTRAVENOUS

## 2011-04-26 MED ORDER — ADULT MULTIVITAMIN W/MINERALS CH
1.0000 | ORAL_TABLET | Freq: Every day | ORAL | Status: DC
  Administered 2011-04-27 – 2011-04-29 (×3): 1 via ORAL
  Filled 2011-04-26 (×3): qty 1

## 2011-04-26 MED ORDER — FERROUS SULFATE 325 (65 FE) MG PO TABS: 325.0000 mg | ORAL_TABLET | Freq: Three times a day (TID) | ORAL | Status: DC

## 2011-04-26 MED ORDER — FENTANYL CITRATE 0.05 MG/ML IJ SOLN
25.0000 ug | INTRAMUSCULAR | Status: DC | PRN
  Administered 2011-04-26 (×2): 25 ug via INTRAVENOUS
  Administered 2011-04-26: 50 ug via INTRAVENOUS

## 2011-04-26 MED ORDER — METFORMIN HCL 500 MG PO TABS
500.0000 mg | ORAL_TABLET | Freq: Every day | ORAL | Status: DC
  Administered 2011-04-27 – 2011-04-29 (×3): 500 mg via ORAL
  Filled 2011-04-26 (×3): qty 1

## 2011-04-26 MED ORDER — KETOROLAC TROMETHAMINE 30 MG/ML IJ SOLN
INTRAMUSCULAR | Status: AC
Start: 2011-04-26 — End: 2011-04-26
  Filled 2011-04-26: qty 1

## 2011-04-26 MED ORDER — FERROUS SULFATE 325 (65 FE) MG PO TABS
325.0000 mg | ORAL_TABLET | Freq: Two times a day (BID) | ORAL | Status: DC
  Administered 2011-04-27 – 2011-04-29 (×5): 325 mg via ORAL
  Filled 2011-04-26 (×7): qty 1

## 2011-04-26 MED ORDER — NEOSTIGMINE METHYLSULFATE 1 MG/ML IJ SOLN
INTRAMUSCULAR | Status: DC | PRN
  Administered 2011-04-26: 5 mg via INTRAVENOUS

## 2011-04-26 MED ORDER — METHOCARBAMOL 500 MG PO TABS: 500.0000 mg | ORAL_TABLET | Freq: Four times a day (QID) | ORAL | Status: DC | PRN

## 2011-04-26 MED ORDER — HYDROCODONE-ACETAMINOPHEN 7.5-325 MG PO TABS
1.0000 | ORAL_TABLET | ORAL | Status: DC
  Administered 2011-04-27 – 2011-04-29 (×12): 1 via ORAL
  Filled 2011-04-26 (×12): qty 1

## 2011-04-26 MED ORDER — ALLOPURINOL 300 MG PO TABS
300.0000 mg | ORAL_TABLET | Freq: Every day | ORAL | Status: DC
Start: 2011-04-27 — End: 2011-04-29
  Administered 2011-04-27 – 2011-04-29 (×3): 300 mg via ORAL
  Filled 2011-04-26 (×3): qty 1

## 2011-04-26 MED ORDER — LISINOPRIL 20 MG PO TABS
20.0000 mg | ORAL_TABLET | Freq: Every day | ORAL | Status: DC
  Administered 2011-04-28: 20 mg via ORAL
  Filled 2011-04-26 (×3): qty 1

## 2011-04-26 MED ORDER — HYDROCHLOROTHIAZIDE 12.5 MG PO CAPS
12.5000 mg | ORAL_CAPSULE | Freq: Every day | ORAL | Status: DC
  Administered 2011-04-27 – 2011-04-29 (×3): 12.5 mg via ORAL
  Filled 2011-04-26 (×3): qty 1

## 2011-04-26 MED ORDER — DIPHENHYDRAMINE HCL 12.5 MG/5ML PO ELIX: 12.5000 mg | ORAL_SOLUTION | ORAL | Status: DC | PRN

## 2011-04-26 MED ORDER — LISINOPRIL-HYDROCHLOROTHIAZIDE 20-12.5 MG PO TABS: 1.0000 | ORAL_TABLET | Freq: Every day | ORAL | Status: DC

## 2011-04-26 MED ORDER — CEFAZOLIN SODIUM 1-5 GM-% IV SOLN
1.0000 g | Freq: Four times a day (QID) | INTRAVENOUS | Status: AC
  Administered 2011-04-27 (×3): 1 g via INTRAVENOUS
  Filled 2011-04-26 (×3): qty 50

## 2011-04-26 MED ORDER — ACETAMINOPHEN 325 MG PO TABS: 650.0000 mg | ORAL_TABLET | Freq: Four times a day (QID) | ORAL | Status: DC | PRN

## 2011-04-26 MED ORDER — METOPROLOL SUCCINATE ER 50 MG PO TB24
50.0000 mg | ORAL_TABLET | Freq: Every day | ORAL | Status: DC
  Administered 2011-04-28 – 2011-04-29 (×2): 50 mg via ORAL
  Filled 2011-04-26 (×3): qty 1

## 2011-04-26 MED ORDER — POLYETHYLENE GLYCOL 3350 17 G PO PACK
17.0000 g | PACK | Freq: Two times a day (BID) | ORAL | Status: DC
  Administered 2011-04-27 – 2011-04-29 (×5): 17 g via ORAL
  Filled 2011-04-26 (×7): qty 1

## 2011-04-26 MED ORDER — RIVAROXABAN 10 MG PO TABS
10.0000 mg | ORAL_TABLET | ORAL | Status: DC
  Administered 2011-04-27 – 2011-04-29 (×3): 10 mg via ORAL
  Filled 2011-04-26 (×3): qty 1

## 2011-04-26 MED ORDER — PREDNISONE 5 MG PO TABS
5.0000 mg | ORAL_TABLET | Freq: Every day | ORAL | Status: DC
  Administered 2011-04-27 – 2011-04-28 (×2): 5 mg via ORAL
  Filled 2011-04-26 (×3): qty 1

## 2011-04-26 MED ORDER — ASPIRIN 81 MG PO CHEW
81.0000 mg | CHEWABLE_TABLET | Freq: Every day | ORAL | Status: DC
  Administered 2011-04-27 – 2011-04-28 (×2): 81 mg via ORAL
  Filled 2011-04-26 (×3): qty 1

## 2011-04-26 MED ORDER — ONDANSETRON HCL 4 MG/2ML IJ SOLN
INTRAMUSCULAR | Status: AC
  Administered 2011-04-26: 4 mg via INTRAVENOUS
  Filled 2011-04-26: qty 2

## 2011-04-26 MED ORDER — BUPIVACAINE-EPINEPHRINE 0.5% -1:200000 IJ SOLN
INTRAMUSCULAR | Status: AC
Start: 2011-04-26 — End: 2011-04-26
  Filled 2011-04-26: qty 1

## 2011-04-26 MED ORDER — SODIUM CHLORIDE 0.9 % IV SOLN
INTRAVENOUS | Status: DC
  Administered 2011-04-27 – 2011-04-29 (×4): via INTRAVENOUS
  Filled 2011-04-26 (×13): qty 1000

## 2011-04-26 MED ORDER — ONDANSETRON HCL 4 MG PO TABS: 4.0000 mg | ORAL_TABLET | Freq: Four times a day (QID) | ORAL | Status: DC | PRN

## 2011-04-26 MED ORDER — METHOCARBAMOL 100 MG/ML IJ SOLN
500.0000 mg | Freq: Four times a day (QID) | INTRAVENOUS | Status: DC | PRN
  Administered 2011-04-26: 500 mg via INTRAVENOUS
  Filled 2011-04-26: qty 5

## 2011-04-26 MED ORDER — CEFAZOLIN SODIUM 1-5 GM-% IV SOLN
1.0000 g | INTRAVENOUS | Status: AC
  Administered 2011-04-26: 2 g via INTRAVENOUS

## 2011-04-26 MED ORDER — HYDROMORPHONE HCL PF 1 MG/ML IJ SOLN
0.5000 mg | INTRAMUSCULAR | Status: DC | PRN
  Administered 2011-04-27: 0.5 mg via INTRAVENOUS
  Filled 2011-04-26: qty 1

## 2011-04-26 MED ORDER — ROCURONIUM BROMIDE 100 MG/10ML IV SOLN
INTRAVENOUS | Status: DC | PRN
  Administered 2011-04-26: 30 mg via INTRAVENOUS

## 2011-04-26 MED ORDER — LACTATED RINGERS IV SOLN: INTRAVENOUS | Status: DC

## 2011-04-26 MED ORDER — ROSUVASTATIN CALCIUM 40 MG PO TABS
40.0000 mg | ORAL_TABLET | Freq: Every day | ORAL | Status: DC
  Administered 2011-04-27 – 2011-04-28 (×2): 40 mg via ORAL
  Filled 2011-04-26 (×3): qty 1

## 2011-04-26 MED ORDER — PHENOL 1.4 % MT LIQD
1.0000 | OROMUCOSAL | Status: DC | PRN
  Filled 2011-04-26: qty 177

## 2011-04-26 MED ORDER — 0.9 % SODIUM CHLORIDE (POUR BTL) OPTIME
TOPICAL | Status: DC | PRN
  Administered 2011-04-26: 1000 mL

## 2011-04-26 MED ORDER — LACTATED RINGERS IV SOLN
INTRAVENOUS | Status: DC | PRN
  Administered 2011-04-26: 18:00:00 via INTRAVENOUS

## 2011-04-26 MED ORDER — PROMETHAZINE HCL 25 MG/ML IJ SOLN: 6.2500 mg | INTRAMUSCULAR | Status: DC | PRN

## 2011-04-26 MED ORDER — CEFAZOLIN SODIUM-DEXTROSE 2-3 GM-% IV SOLR
INTRAVENOUS | Status: AC
Start: 2011-04-26 — End: 2011-04-26
  Filled 2011-04-26: qty 50

## 2011-04-26 MED ORDER — DOCUSATE SODIUM 100 MG PO CAPS
100.0000 mg | ORAL_CAPSULE | Freq: Two times a day (BID) | ORAL | Status: DC
  Administered 2011-04-27 – 2011-04-29 (×5): 100 mg via ORAL
  Filled 2011-04-26 (×7): qty 1

## 2011-04-26 MED ORDER — FENTANYL CITRATE 0.05 MG/ML IJ SOLN
INTRAMUSCULAR | Status: AC
  Filled 2011-04-26: qty 2

## 2011-04-26 MED ORDER — EPHEDRINE SULFATE 50 MG/ML IJ SOLN
INTRAMUSCULAR | Status: DC | PRN
  Administered 2011-04-26: 5 mg via INTRAVENOUS

## 2011-04-26 MED ORDER — METOCLOPRAMIDE HCL 5 MG/ML IJ SOLN: 5.0000 mg | Freq: Three times a day (TID) | INTRAMUSCULAR | Status: DC | PRN

## 2011-04-26 MED ORDER — GLYCOPYRROLATE 0.2 MG/ML IJ SOLN
INTRAMUSCULAR | Status: DC | PRN
  Administered 2011-04-26: .8 mg via INTRAVENOUS

## 2011-04-26 MED ORDER — LIDOCAINE HCL 1 % IJ SOLN
INTRAMUSCULAR | Status: DC | PRN
  Administered 2011-04-26: 19:00:00 via INTRAMUSCULAR

## 2011-04-26 MED ORDER — LIDOCAINE HCL (CARDIAC) 20 MG/ML IV SOLN
INTRAVENOUS | Status: DC | PRN
  Administered 2011-04-26: 80 mg via INTRAVENOUS

## 2011-04-26 MED ORDER — FENTANYL CITRATE 0.05 MG/ML IJ SOLN
INTRAMUSCULAR | Status: DC | PRN
  Administered 2011-04-26 (×4): 50 ug via INTRAVENOUS

## 2011-04-26 MED ORDER — HYDROMORPHONE HCL PF 1 MG/ML IJ SOLN
INTRAMUSCULAR | Status: AC
  Filled 2011-04-26: qty 1

## 2011-04-26 MED ORDER — ONDANSETRON HCL 4 MG/2ML IJ SOLN
INTRAMUSCULAR | Status: DC | PRN
  Administered 2011-04-26: 4 mg via INTRAVENOUS

## 2011-04-26 MED ORDER — AMLODIPINE BESYLATE 10 MG PO TABS
10.0000 mg | ORAL_TABLET | Freq: Every day | ORAL | Status: DC
Start: 2011-04-27 — End: 2011-04-29
  Administered 2011-04-28: 10 mg via ORAL
  Filled 2011-04-26 (×3): qty 1

## 2011-04-26 MED ORDER — ACETAMINOPHEN 10 MG/ML IV SOLN
INTRAVENOUS | Status: AC
  Filled 2011-04-26: qty 100

## 2011-04-26 MED ORDER — VITAMIN D 1000 UNITS PO TABS
1000.0000 [IU] | ORAL_TABLET | Freq: Every day | ORAL | Status: DC
  Administered 2011-04-27 – 2011-04-29 (×3): 1000 [IU] via ORAL
  Filled 2011-04-26 (×3): qty 1

## 2011-04-26 MED ORDER — ACETAMINOPHEN 650 MG RE SUPP: 650.0000 mg | Freq: Four times a day (QID) | RECTAL | Status: DC | PRN

## 2011-04-26 MED ORDER — ACETAMINOPHEN 10 MG/ML IV SOLN
1000.0000 mg | Freq: Four times a day (QID) | INTRAVENOUS | Status: DC
  Administered 2011-04-26: 1000 mg via INTRAVENOUS

## 2011-04-26 MED ORDER — CHLORHEXIDINE GLUCONATE 4 % EX LIQD: 60.0000 mL | Freq: Once | CUTANEOUS | Status: DC

## 2011-04-26 MED ORDER — METOCLOPRAMIDE HCL 10 MG PO TABS: 5.0000 mg | ORAL_TABLET | Freq: Three times a day (TID) | ORAL | Status: DC | PRN

## 2011-04-26 MED ORDER — MENTHOL 3 MG MT LOZG
1.0000 | LOZENGE | OROMUCOSAL | Status: DC | PRN
  Filled 2011-04-26: qty 9

## 2011-04-26 MED ORDER — BIMATOPROST 0.01 % OP SOLN
1.0000 [drp] | Freq: Every day | OPHTHALMIC | Status: DC
  Administered 2011-04-27: 1 [drp] via OPHTHALMIC
  Filled 2011-04-26 (×2): qty 2.5

## 2011-04-26 MED ORDER — ONDANSETRON HCL 4 MG/2ML IJ SOLN
4.0000 mg | Freq: Four times a day (QID) | INTRAMUSCULAR | Status: DC | PRN
  Administered 2011-04-26: 4 mg via INTRAVENOUS

## 2011-04-26 MED ORDER — PROPOFOL 10 MG/ML IV EMUL
INTRAVENOUS | Status: DC | PRN
  Administered 2011-04-26: 130 mg via INTRAVENOUS

## 2011-04-26 MED ORDER — KETOROLAC TROMETHAMINE 30 MG/ML IJ SOLN
INTRAMUSCULAR | Status: DC | PRN
  Administered 2011-04-26: 30 mg via INTRAMUSCULAR

## 2011-04-26 MED ORDER — FOLIC ACID 1 MG PO TABS
1.0000 mg | ORAL_TABLET | Freq: Every day | ORAL | Status: DC
  Administered 2011-04-27 – 2011-04-29 (×3): 1 mg via ORAL
  Filled 2011-04-26 (×3): qty 1

## 2011-04-26 MED ORDER — ALUM & MAG HYDROXIDE-SIMETH 200-200-20 MG/5ML PO SUSP: 30.0000 mL | ORAL | Status: DC | PRN

## 2011-04-26 SURGICAL SUPPLY — 59 items
BAG ZIPLOCK 12X15 (MISCELLANEOUS) ×2 IMPLANT
BANDAGE ELASTIC 6 VELCRO ST LF (GAUZE/BANDAGES/DRESSINGS) ×2 IMPLANT
BANDAGE ESMARK 6X9 LF (GAUZE/BANDAGES/DRESSINGS) ×1 IMPLANT
BLADE SAW SGTL 13.0X1.19X90.0M (BLADE) ×2 IMPLANT
BNDG ESMARK 6X9 LF (GAUZE/BANDAGES/DRESSINGS) ×2
BONE CEMENT GENTAMICIN (Cement) ×4 IMPLANT
BOWL SMART MIX CTS (DISPOSABLE) ×2 IMPLANT
CEMENT BONE GENTAMICIN 40 (Cement) ×2 IMPLANT
CLOTH BEACON ORANGE TIMEOUT ST (SAFETY) ×2 IMPLANT
CUFF TOURN SGL QUICK 34 (TOURNIQUET CUFF) ×1
CUFF TRNQT CYL 34X4X40X1 (TOURNIQUET CUFF) ×1 IMPLANT
DECANTER SPIKE VIAL GLASS SM (MISCELLANEOUS) ×2 IMPLANT
DERMABOND ADVANCED (GAUZE/BANDAGES/DRESSINGS) ×1
DERMABOND ADVANCED .7 DNX12 (GAUZE/BANDAGES/DRESSINGS) ×1 IMPLANT
DRAPE EXTREMITY T 121X128X90 (DRAPE) ×2 IMPLANT
DRAPE POUCH INSTRU U-SHP 10X18 (DRAPES) ×2 IMPLANT
DRAPE U-SHAPE 47X51 STRL (DRAPES) ×2 IMPLANT
DRSG AQUACEL AG ADV 3.5X10 (GAUZE/BANDAGES/DRESSINGS) ×2 IMPLANT
DRSG PAD ABDOMINAL 8X10 ST (GAUZE/BANDAGES/DRESSINGS) ×2 IMPLANT
DRSG TEGADERM 4X4.75 (GAUZE/BANDAGES/DRESSINGS) ×2 IMPLANT
DURAPREP 26ML APPLICATOR (WOUND CARE) ×2 IMPLANT
ELECT REM PT RETURN 9FT ADLT (ELECTROSURGICAL) ×2
ELECTRODE REM PT RTRN 9FT ADLT (ELECTROSURGICAL) ×1 IMPLANT
EVACUATOR 1/8 PVC DRAIN (DRAIN) ×2 IMPLANT
FACESHIELD LNG OPTICON STERILE (SAFETY) ×10 IMPLANT
GAUZE SPONGE 2X2 8PLY STRL LF (GAUZE/BANDAGES/DRESSINGS) ×1 IMPLANT
GAUZE SPONGE 4X4 12PLY STRL LF (GAUZE/BANDAGES/DRESSINGS) ×2 IMPLANT
GAUZE XEROFORM 5X9 LF (GAUZE/BANDAGES/DRESSINGS) ×2 IMPLANT
GLOVE BIOGEL PI IND STRL 7.5 (GLOVE) ×1 IMPLANT
GLOVE BIOGEL PI IND STRL 8 (GLOVE) ×1 IMPLANT
GLOVE BIOGEL PI INDICATOR 7.5 (GLOVE) ×1
GLOVE BIOGEL PI INDICATOR 8 (GLOVE) ×1
GLOVE ECLIPSE 8.0 STRL XLNG CF (GLOVE) ×2 IMPLANT
GLOVE ORTHO TXT STRL SZ7.5 (GLOVE) ×4 IMPLANT
GOWN BRE IMP PREV XXLGXLNG (GOWN DISPOSABLE) ×2 IMPLANT
GOWN STRL NON-REIN LRG LVL3 (GOWN DISPOSABLE) ×2 IMPLANT
HANDPIECE INTERPULSE COAX TIP (DISPOSABLE) ×1
IMMOBILIZER KNEE 20 (SOFTGOODS)
IMMOBILIZER KNEE 20 THIGH 36 (SOFTGOODS) IMPLANT
KIT BASIN OR (CUSTOM PROCEDURE TRAY) ×2 IMPLANT
MANIFOLD NEPTUNE II (INSTRUMENTS) ×2 IMPLANT
NDL SAFETY ECLIPSE 18X1.5 (NEEDLE) ×1 IMPLANT
NEEDLE HYPO 18GX1.5 SHARP (NEEDLE) ×1
NS IRRIG 1000ML POUR BTL (IV SOLUTION) ×4 IMPLANT
PACK TOTAL JOINT (CUSTOM PROCEDURE TRAY) ×2 IMPLANT
POSITIONER SURGICAL ARM (MISCELLANEOUS) ×2 IMPLANT
SET HNDPC FAN SPRY TIP SCT (DISPOSABLE) ×1 IMPLANT
SET PAD KNEE POSITIONER (MISCELLANEOUS) ×2 IMPLANT
SPONGE GAUZE 2X2 STER 10/PKG (GAUZE/BANDAGES/DRESSINGS) ×1
SUCTION FRAZIER 12FR DISP (SUCTIONS) ×2 IMPLANT
SUT MNCRL AB 4-0 PS2 18 (SUTURE) ×2 IMPLANT
SUT VIC AB 1 CT1 36 (SUTURE) ×6 IMPLANT
SUT VIC AB 2-0 CT1 27 (SUTURE) ×3
SUT VIC AB 2-0 CT1 TAPERPNT 27 (SUTURE) ×3 IMPLANT
SYR 50ML LL SCALE MARK (SYRINGE) ×2 IMPLANT
TOWEL OR 17X26 10 PK STRL BLUE (TOWEL DISPOSABLE) ×4 IMPLANT
TRAY FOLEY CATH 14FRSI W/METER (CATHETERS) ×2 IMPLANT
WATER STERILE IRR 1500ML POUR (IV SOLUTION) ×2 IMPLANT
WRAP KNEE MAXI GEL POST OP (GAUZE/BANDAGES/DRESSINGS) ×2 IMPLANT

## 2011-04-26 NOTE — Anesthesia Postprocedure Evaluation (Signed)
  Anesthesia Post-op Note  Patient: Joanne Valdez  Procedure(s) Performed:  TOTAL KNEE ARTHROPLASTY  Patient Location: PACU  Anesthesia Type: General  Level of Consciousness: awake, alert , oriented, patient cooperative and responds to stimulation  Airway and Oxygen Therapy: Patient Spontanous Breathing and Patient connected to nasal cannula oxygen  Post-op Pain: mild  Post-op Assessment: Post-op Vital signs reviewed, Patient's Cardiovascular Status Stable, Respiratory Function Stable, Patent Airway, No signs of Nausea or vomiting, Adequate PO intake and Pain level controlled  Post-op Vital Signs: Reviewed and stable  Complications: No apparent anesthesia complications

## 2011-04-26 NOTE — Op Note (Signed)
NAME:  Joanne Valdez                      MEDICAL RECORD NO.:  [id number]                             FACILITY:  [facility]      PHYSICIAN:  Madlyn Frankel. Charlann Boxer, M.D.  DATE OF BIRTH:  [birth year]      DATE OF PROCEDURE:  04/26/2011                                     OPERATIVE REPORT         PREOPERATIVE DIAGNOSIS:  Left knee osteoarthritis.      POSTOPERATIVE DIAGNOSIS:  Left knee osteoarthritis, Gouty      FINDINGS:  The patient was noted to have complete loss of cartilage and   bone-on-bone arthritis with associated osteophytes in the medial, lateral and patellofemoral compartments of   the knee with a significant bony erossion, synovitis and associated effusion. Effusion was noted to be inflammatory in character     PROCEDURE:  Left total knee replacement.      COMPONENTS USED:  DePuy rotating platform posterior stabilized knee   system, a size 5 femur, 5 tibia, 10 mm insert, and 41 patellar   button.      SURGEON:  Madlyn Frankel. Charlann Boxer, M.D.      ASSISTANT:  Lanney Gins, PA-C.      ANESTHESIA:  General.      SPECIMENS:  None.      COMPLICATION:  None.      DRAINS:  One Hemovac.  EBL: 100cc      TOURNIQUET TIME:   Total Tourniquet Time Documented: Thigh (Left) - 46 minutes .      The patient was stable to the recovery room.      INDICATION FOR PROCEDURE:  Joanne Valdez is a 78 y.o. female patient of   mine.  The patient had been seen, evaluated, and treated conservatively in the   office with medication, activity modification, and injections.  The patient had   radiographic changes of bone-on-bone arthritis with endplate sclerosis and osteophytes noted.      The patient failed conservative measures including medication, injections, and activity modification, and at this point was ready for more definitive measures.   Based on the radiographic changes and failed conservative measures, the patient   decided to proceed with total knee replacement.  Risks of infection,   DVT, component failure, need for revision surgery, postop course, and   expectations were all   discussed and reviewed.  Consent was obtained for benefit of pain   relief.      PROCEDURE IN DETAIL:  The patient was brought to the operative theater.   Once adequate anesthesia, preoperative antibiotics, 2 gm of Ancef administered, the patient was positioned supine with the left thigh tourniquet placed.  The  left lower extremity was prepped and draped in sterile fashion.  A time-   out was performed identifying the patient, planned procedure, and   extremity.      The left lower extremity was placed in the [facility] leg holder.  The leg was   exsanguinated, tourniquet elevated to 250 mmHg.  A midline incision was   made followed by median parapatellar arthrotomy.  Following initial   exposure,  attention was first directed to the patella.  Precut   measurement was noted to be 24 mm.  I resected down to 14 mm and used a   41 patellar button to restore patellar height as well as cover the cut   surface.      The lug holes were drilled and a metal shim was placed to protect the   patella from retractors and saw blades.      At this point, attention was now directed to the femur.  The femoral   canal was opened with a drill, irrigated to try to prevent fat emboli.  An   intramedullary rod was passed at 5 degrees valgus, 10 mm of bone was   resected off the distal femur.  Following this resection, the tibia was   subluxated anteriorly.  Using the extramedullary guide, 8 mm of bone was resected off   the proximal lateral tibia.  We confirmed the gap would be   stable medially and laterally with a 10 mm insert as well as confirmed   the cut was perpendicular in the coronal plane, checking with an alignment rod.      Once this was done, I sized the femur to be a size 5 in the anterior-   posterior dimension, chose a standard component based on medial and   lateral dimension.  The size 5 rotation  block was then pinned in   position anterior referenced using the C-clamp to set rotation.  The   anterior, posterior, and  chamfer cuts were made without difficulty nor   notching making certain that I was along the anterior cortex to help   with flexion gap stability.      The final box cut was made off the lateral aspect of distal femur.      At this point, the tibia was sized to be a size 5, the size 5 tray was   then pinned in position through the medial third of the tubercle,   drilled, and keel punched.  Trial reduction was now carried with a 5 femur,  5 tibia, a 10 mm insert, and the 41 patella botton.  The knee was brought to   extension, full extension with good flexion stability with the patella   tracking through the trochlea without application of pressure.  Given   all these findings, the trial components removed.  Final components were   opened and cement was mixed.  The knee was irrigated with normal saline   solution and pulse lavage.  The synovial lining was   then injected with 0.25% Marcaine with epinephrine and 1 cc of Toradol,   total of 61 cc.      The knee was irrigated.  Final implants were then cemented onto clean and   dried cut surfaces of bone with the knee brought to extension with a 10   mm trial insert.      Once the cement had fully cured, the excess cement was removed   throughout the knee.  I confirmed I was satisfied with the range of   motion and stability, and the final 10 mm insert was chosen.  It was   placed into the knee.      The tourniquet had been let down at 46 minutes.  No significant   hemostasis required.  The medium Hemovac drain was placed deep.  The   extensor mechanism was then reapproximated using #1 Vicryl with the knee   in flexion.  The   remaining wound was closed with 2-0 Vicryl and running 4-0 Monocryl.   The knee was cleaned, dried, dressed sterilely using Dermabond and   Aquacel dressing.  Drain site dressed separately.   The patient was then   brought to recovery room in stable condition, tolerating the procedure   well.   Please note that Physician Assistant, Lanney Gins, was present for the entirety of the case, and was utilized for pre-operative positioning, peri-operative retractor management, general facilitation of the procedure.  He was also utilized for primary wound closure at the end of the case.              Madlyn Frankel Charlann Boxer, M.D.

## 2011-04-26 NOTE — Preoperative (Signed)
Beta Blockers   Reason not to administer Beta Blockers:Toprol 50 mg taken at 0930 04-26-11

## 2011-04-26 NOTE — Interval H&P Note (Signed)
History and Physical Interval Note:  04/26/2011 6:00 PM  [name]  has presented today for surgery, with the diagnosis of Osteoarthritis of the Left Knee  The various methods of treatment have been discussed with the patient and family. After consideration of risks, benefits and other options for treatment, the patient has consented to  Procedure(s): LEFT TOTAL KNEE ARTHROPLASTY as a surgical intervention .  The patients' history has been reviewed, patient examined, no change in status, stable for surgery.  I have reviewed the patients' chart and labs.  Questions were answered to the patient's satisfaction.     Joanne Valdez

## 2011-04-26 NOTE — Transfer of Care (Signed)
Immediate Anesthesia Transfer of Care Note  Patient: Joanne Valdez  Procedure(s) Performed:  TOTAL KNEE ARTHROPLASTY  Patient Location: PACU  Anesthesia Type: General  Level of Consciousness: awake, alert , oriented and patient cooperative  Airway & Oxygen Therapy: Patient Spontanous Breathing and Patient connected to face mask oxygen  Post-op Assessment: Report given to PACU RN, Post -op Vital signs reviewed and stable and Patient moving all extremities  Post vital signs: Reviewed and stable  Complications: No apparent anesthesia complications

## 2011-04-26 NOTE — Anesthesia Preprocedure Evaluation (Signed)
Anesthesia Evaluation  Patient identified by MRN, date of birth, ID band Patient awake    Reviewed: Allergy & Precautions, H&P , NPO status , Patient's Chart, lab work & pertinent test results, reviewed documented beta blocker date and time   History of Anesthesia Complications Negative for: history of anesthetic complications  Airway Mallampati: III TM Distance: >3 FB     Dental  (+) Poor Dentition and Partial Lower   Pulmonary neg pulmonary ROS,  clear to auscultation  Pulmonary exam normal       Cardiovascular Exercise Tolerance: Poor hypertension, Pt. on medications and Pt. on home beta blockers Regular Normal    Neuro/Psych Negative Neurological ROS  Negative Psych ROS   GI/Hepatic negative GI ROS, Neg liver ROS,   Endo/Other  Diabetes mellitus-, Type 2, Insulin Dependent and Oral Hypoglycemic Agents  Renal/GU Renal InsufficiencyRenal diseasenegative Renal ROSS/P right nephrectomy; patient unaware of cause for surgery  Genitourinary negative   Musculoskeletal negative musculoskeletal ROS (+) Arthritis -,   Abdominal (+) obese,   Peds  Hematology negative hematology ROS (+)   Anesthesia Other Findings   Reproductive/Obstetrics negative OB ROS                           Anesthesia Physical Anesthesia Plan  ASA: III  Anesthesia Plan: General   Post-op Pain Management:    Induction: Intravenous  Airway Management Planned: Oral ETT  Additional Equipment:   Intra-op Plan:   Post-operative Plan: Extubation in OR  Informed Consent: I have reviewed the patients History and Physical, chart, labs and discussed the procedure including the risks, benefits and alternatives for the proposed anesthesia with the patient or authorized representative who has indicated his/her understanding and acceptance.   Dental advisory given  Plan Discussed with: CRNA  Anesthesia Plan Comments:          Anesthesia Quick Evaluation

## 2011-04-27 DIAGNOSIS — Z96659 Presence of unspecified artificial knee joint: Secondary | ICD-10-CM

## 2011-04-27 LAB — BASIC METABOLIC PANEL
BUN: 26 mg/dL — ABNORMAL HIGH (ref 6–23)
CO2: 23 mEq/L (ref 19–32)
Chloride: 107 mEq/L (ref 96–112)
GFR calc non Af Amer: 39 mL/min — ABNORMAL LOW (ref >90)
Glucose, Bld: 143 mg/dL — ABNORMAL HIGH (ref 70–99)
Potassium: 4.4 mEq/L (ref 3.5–5.1)

## 2011-04-27 LAB — GLUCOSE, CAPILLARY
Glucose-Capillary: 122 mg/dL — ABNORMAL HIGH (ref 70–99)
Glucose-Capillary: 113 mg/dL — ABNORMAL HIGH (ref 70–99)
Glucose-Capillary: 117 mg/dL — ABNORMAL HIGH (ref 70–99)
Glucose-Capillary: 113 mg/dL — ABNORMAL HIGH (ref 70–99)

## 2011-04-27 LAB — CBC
HCT: 26.4 % — ABNORMAL LOW (ref 36.0–46.0)
Hemoglobin: 8.5 g/dL — ABNORMAL LOW (ref 12.0–15.0)
MCHC: 32.2 g/dL (ref 30.0–36.0)

## 2011-04-27 MED FILL — Mupirocin Oint 2%: CUTANEOUS | Qty: 22 | Status: AC

## 2011-04-27 NOTE — Progress Notes (Signed)
Physical Therapy Treatment Patient Details Name: Joanne Valdez MRN: [id number] DOB: 04/11/1926 Today's Date: 04/27/2011  1325-1346 TE  PT Assessment/Plan  PT - Assessment/Plan Comments on Treatment Session: Pt tolerated exercises well with no c/o pain.  Family present during session and state that she will be moving to regular floor today.   PT Plan: Discharge plan remains appropriate PT Frequency: 7X/week Follow Up Recommendations: Skilled nursing facility Equipment Recommended: Defer to next venue PT Goals  Acute Rehab PT Goals PT Goal Formulation: With patient Time For Goal Achievement: 2 weeks Pt will go Supine/Side to Sit: with min assist PT Goal: Supine/Side to Sit - Progress: Goal set today Pt will go Sit to Supine/Side: with min assist PT Goal: Sit to Supine/Side - Progress: Goal set today Pt will go Sit to Stand: with min assist PT Goal: Sit to Stand - Progress: Goal set today Pt will go Stand to Sit: with min assist PT Goal: Stand to Sit - Progress: Goal set today Pt will Ambulate: 16 - 50 feet;with min assist;with rolling walker PT Goal: Ambulate - Progress: Goal set today Pt will Perform Home Exercise Program: with min assist PT Goal: Perform Home Exercise Program - Progress: Progressing toward goal  PT Treatment Precautions/Restrictions  Precautions Precautions: Fall Knee Immobilizer: Discontinue once straight leg raise with < 10 degree lag Restrictions LLE Weight Bearing: Weight bearing as tolerated Mobility (including Balance) Exercise  Total Joint Exercises Ankle Circles/Pumps: AROM;Both;20 reps;Supine Quad Sets: AROM;Left;10 reps;Supine Short Arc Quad: AAROM;Left;10 reps;Supine Heel Slides: AAROM;Left;10 reps;Supine Hip ABduction/ADduction: AAROM;Left;10 reps;Supine Straight Leg Raises: AAROM;Left;10 reps;Supine End of Session PT - End of Session Equipment Utilized During Treatment: Gait belt;Left knee immobilizer Activity Tolerance: Patient  tolerated treatment well Patient left: in bed;with call bell in reach;with family/visitor present Nurse Communication: Mobility status for transfers General Behavior During Session: [facility] for tasks performed Cognition: [facility] for tasks performed  Page, Meribeth Mattes 04/27/2011, 1:51 PM

## 2011-04-27 NOTE — Progress Notes (Signed)
FL2 in shadow chart for MD signature. Pt is willing to consider ST SNF placement . Pt has been faxed out to [facility]. [facility] contacted at pt's request. SNF expects to have availability for pt when ready for d/c. CSW will continue to assist with d/c planning.

## 2011-04-27 NOTE — Evaluation (Signed)
Physical Therapy Evaluation Patient Details Name: Joanne Valdez MRN: [id number] DOB: 10/04/1926 Today's Date: 04/27/2011 Ev 2 Problem List:  Patient Active Problem List  Diagnoses  . Diabetes mellitus  . Rheumatoid arthritis  . Hyperlipidemia LDL goal <70  . Tachycardia  . Anemia  . S/P left knee replacement    Past Medical History:  Past Medical History  Diagnosis Date  . Diabetes mellitus   . Obesity   . Hypertension   . Gout   . Dyslipidemia   . Arthritis     RHEUMATOID  . Glaucoma   . Colonic polyp   . Anemia   . Renal insufficiency    Past Surgical History:  Past Surgical History  Procedure Date  . Thyroidectomy   . Nephrectomy     right - 1980  . Eye surgery     bilateral cataracts removed     PT Assessment/Plan/Recommendation PT Assessment Clinical Impression Statement: pt will benefit from PT to maximize independence for next venue of care. pt states she desires HOME, but she will likely benefit from STSNF, if home willneed 24 hour assist initially PT Recommendation/Assessment: Patient will need skilled PT in the acute care venue PT Problem List: Decreased strength;Decreased range of motion;Decreased activity tolerance;Decreased mobility;Decreased knowledge of precautions;Pain;Decreased knowledge of use of DME PT Therapy Diagnosis : Difficulty walking PT Plan PT Frequency: 7X/week PT Treatment/Interventions: DME instruction;Gait training;Functional mobility training;Therapeutic activities;Therapeutic exercise;Patient/family education PT Recommendation Follow Up Recommendations: Skilled nursing facility Equipment Recommended: Defer to next venue PT Goals  Acute Rehab PT Goals PT Goal Formulation: With patient Time For Goal Achievement: 2 weeks Pt will go Supine/Side to Sit: with min assist PT Goal: Supine/Side to Sit - Progress: Goal set today Pt will go Sit to Supine/Side: with min assist PT Goal: Sit to Supine/Side - Progress: Goal set today Pt  will go Sit to Stand: with min assist PT Goal: Sit to Stand - Progress: Goal set today Pt will go Stand to Sit: with min assist PT Goal: Stand to Sit - Progress: Goal set today Pt will Ambulate: 16 - 50 feet;with min assist;with rolling walker PT Goal: Ambulate - Progress: Goal set today Pt will Perform Home Exercise Program: with min assist PT Goal: Perform Home Exercise Program - Progress: Goal set today  PT Evaluation Precautions/Restrictions  Precautions Precautions: Fall Knee Immobilizer: Discontinue once straight leg raise with < 10 degree lag Restrictions LLE Weight Bearing: Weight bearing as tolerated Prior Functioning  Home Living Lives With: Family (76yo grand dtr) Receives Help From: Family Type of Home: House Home Layout: One level Home Access: Level entry Home Adaptive Equipment: Bedside commode/3-in-1;Wheelchair - manual;Walker - rolling Prior Function Level of Independence: Independent with basic ADLs;Requires assistive device for independence Cognition Cognition Orientation Level: Oriented X4 Sensation/Coordination   Extremity Assessment RUE Strength RUE Overall Strength: Deficits (grossly 3+/5 elbow ext) LUE Strength LUE Overall Strength: Deficits (elbow ext 2/5) RLE Assessment RLE Assessment:  (>3+/5 grossly for activity tested) LLE Strength LLE Overall Strength: Deficits;Due to pain (able to assist with SLR) Mobility (including Balance) Bed Mobility Supine to Sit: 1: +2 Total assist;Patient percentage (comment) Supine to Sit Details (indicate cue type and reason): pt=50%; +2 lines, safety, UB/LB Sitting - Scoot to Edge of Bed: 1: +2 Total assist;Patient percentage (comment) Sitting - Scoot to Edge of Bed Details (indicate cue type and reason): pt=50%; +2 lines, safety, UB/LB Sit to Supine: 1: +2 Total assist;Patient percentage (comment) Sit to Supine - Details (indicate cue type and  reason): pt=50%; +2 lines, safety, UB/LB Transfers Sit to Stand:  From bed;From elevated surface;1: +2 Total assist Sit to Stand Details (indicate cue type and reason): +2 for wt shift, safety, lines, pt balance; pt=40% Stand to Sit: 1: +2 Total assist;Patient percentage (comment) Stand to Sit Details: +2 for wt shift, safety, lines, pt balance; pt=40% Stand Pivot Transfers: 1: +2 Total assist;Patient percentage (comment) Stand Pivot Transfer Details (indicate cue type and reason): +2 for wt shift, safety, lines, pt balance; pt=25-35%; pt with LOB to right, requiring assist to advance LEs and mulitmodal cues for posture, RW, use of UEs, sequence; stand step pivot    Exercise  Total Joint Exercises Ankle Circles/Pumps: AROM;5 reps;Both End of Session PT - End of Session Equipment Utilized During Treatment: Gait belt;Left knee immobilizer Activity Tolerance: Patient limited by fatigue Patient left: in chair;with call bell in reach Nurse Communication: Mobility status for transfers General Behavior During Session: [facility] for tasks performed Cognition: [facility] for tasks performed  [facility] 04/27/2011, 10:55 AM

## 2011-04-28 ENCOUNTER — Encounter (HOSPITAL_COMMUNITY): Payer: Self-pay | Admitting: Orthopedic Surgery

## 2011-04-28 LAB — GLUCOSE, CAPILLARY
Glucose-Capillary: 111 mg/dL — ABNORMAL HIGH (ref 70–99)
Glucose-Capillary: 124 mg/dL — ABNORMAL HIGH (ref 70–99)
Glucose-Capillary: 130 mg/dL — ABNORMAL HIGH (ref 70–99)
Glucose-Capillary: 139 mg/dL — ABNORMAL HIGH (ref 70–99)

## 2011-04-28 LAB — CBC
MCH: 31.6 pg (ref 26.0–34.0)
MCHC: 33.3 g/dL (ref 30.0–36.0)
Platelets: 201 10*3/uL (ref 150–400)
RDW: 16.2 % — ABNORMAL HIGH (ref 11.5–15.5)

## 2011-04-28 LAB — BASIC METABOLIC PANEL
Calcium: 11 mg/dL — ABNORMAL HIGH (ref 8.4–10.5)
Creatinine, Ser: 1.12 mg/dL — ABNORMAL HIGH (ref 0.50–1.10)
GFR calc non Af Amer: 44 mL/min — ABNORMAL LOW (ref >90)
Sodium: 133 mEq/L — ABNORMAL LOW (ref 135–145)

## 2011-04-28 MED ORDER — FUROSEMIDE 10 MG/ML IJ SOLN
10.0000 mg | Freq: Once | INTRAMUSCULAR | Status: DC
  Filled 2011-04-28: qty 1

## 2011-04-28 NOTE — Plan of Care (Deleted)
Problem: Consults Goal: Diagnosis- Total Joint Replacement Hemiarthroplasty     

## 2011-04-28 NOTE — Progress Notes (Signed)
  CARE MANAGEMENT NOTE 04/28/2011  Patient:  Joanne Valdez, Joanne Valdez   Account Number:  [id number]  Date Initiated:  04/28/2011  Documentation initiated by:  Colleen Can  Subjective/Objective Assessment:   DX OSTEOARTHRITIS KNEE-LEFT; TOTAL KNEE REPLACEMNT     Action/Plan:   ST SNF   Anticipated DC Date:  04/29/2011   Anticipated DC Plan:  SKILLED NURSING FACILITY  In-house referral  Clinical Social Worker      DC Planning Services  NA      [facility] Choice  NA   Choice offered to / List presented to:  NA   DME arranged  NA      DME agency  NA     HH arranged  NA      HH agency  NA   Status of service:  Completed, signed off

## 2011-04-28 NOTE — Progress Notes (Signed)
Subjective: 1 Day Post-op Procedure(s) (LRB): TOTAL KNEE ARTHROPLASTY (Left)   Patient reports pain as mild. No events.  Objective:    VITALS:   Filed Vitals:   04/27/11 0505  BP: 111/60  Pulse: 71  Temp: 98.1 F (36.7 C)  Resp: 18    Neurovascular intact Dorsiflexion/Plantar flexion intact Incision: dressing C/D/I No cellulitis present Compartment soft  LABS  Basename 04/28/11 0315 04/27/11 0315 04/26/11 1600  HGB 7.5* 8.5* 9.4*  HCT 22.5* 26.4* 29.1*  WBC 11.7* 11.5* 7.5  PLT 201 214 47*     Basename 04/28/11 0315 04/27/11 0315 04/26/11 1600  NA 133* 138 140  K 4.7 4.4 4.1  BUN 18 26* 32*  CREATININE 1.12* 1.26* 1.33*  GLUCOSE 124* 143* 82     Assessment/Plan: 1 Day Post-op Procedure(s) (LRB): TOTAL KNEE ARTHROPLASTY (Left)  HV drain d/c'ed Advance diet Up with therapy   Anastasio Auerbach. Herschel Fleagle   PAC  04/28/2011, 7:18 AM

## 2011-04-28 NOTE — Plan of Care (Signed)
Problem: Consults Goal: Diagnosis- Total Joint Replacement Primary Total Knee     

## 2011-04-28 NOTE — Evaluation (Signed)
Occupational Therapy Evaluation Patient Details Name: Joanne Valdez MRN: [id number] DOB: [birth year] Today's Date: 04/28/2011 Time in: 10:45 am Time out: 11:25 am Eval II [place]  Problem List:  Patient Active Problem List  Diagnoses  . Diabetes mellitus  . Rheumatoid arthritis  . Hyperlipidemia LDL goal <70  . Tachycardia  . Anemia  . S/P left knee replacement    Past Medical History:  Past Medical History  Diagnosis Date  . Diabetes mellitus   . Obesity   . Hypertension   . Gout   . Dyslipidemia   . Arthritis     RHEUMATOID  . Glaucoma   . Colonic polyp   . Anemia   . Renal insufficiency    Past Surgical History:  Past Surgical History  Procedure Date  . Thyroidectomy   . Nephrectomy     right - 1980  . Eye surgery     bilateral cataracts removed   . Total knee arthroplasty 04/26/2011    Procedure: TOTAL KNEE ARTHROPLASTY;  Surgeon: Shelda Pal, MD;  Location: [facility];  Service: Orthopedics;  Laterality: Left;    OT Assessment/Plan/Recommendation OT Assessment Clinical Impression Statement: Pt will benefit from skilled OT services to increase her independence with ADL for next venue of care. OT Recommendation/Assessment: Patient will need skilled OT in the acute care venue OT Problem List: Decreased strength;Decreased range of motion;Decreased activity tolerance;Decreased knowledge of use of DME or AE;Pain;Decreased cognition OT Therapy Diagnosis : Generalized weakness;Acute pain OT Plan OT Frequency: Min 1X/week OT Treatment/Interventions: Self-care/ADL training;Therapeutic activities;DME and/or AE instruction;Patient/family education OT Recommendation Follow Up Recommendations: Skilled nursing facility Equipment Recommended: Defer to next venue Individuals Consulted Consulted and Agree with Results and Recommendations: Family member/caregiver;Patient Family Member Consulted: sister and daughter OT Goals Acute Rehab OT Goals OT Goal Formulation: With  patient/family Time For Goal Achievement: 7 days ADL Goals Pt Will Perform Upper Body Bathing: with set-up;Sitting, edge of bed;Sitting, chair ADL Goal: Upper Body Bathing - Progress: Goal set today Pt Will Perform Lower Body Bathing: with mod assist;Sit to stand from chair;Sit to stand from bed ADL Goal: Lower Body Bathing - Progress: Goal set today Pt Will Transfer to Toilet: with mod assist;Stand pivot transfer;3-in-1 ADL Goal: Toilet Transfer - Progress: Goal set today Pt Will Perform Toileting - Clothing Manipulation: with mod assist;Standing ADL Goal: Toileting - Clothing Manipulation - Progress: Goal set today  OT Evaluation Precautions/Restrictions  Precautions Precautions: Knee Required Braces or Orthoses: Yes Knee Immobilizer: Discontinue once straight leg raise with < 10 degree lag Restrictions Weight Bearing Restrictions: Yes LLE Weight Bearing: Weight bearing as tolerated Prior Functioning Home Living Lives With: Family (76yo grand dtr) Receives Help From: Family Type of Home: House Home Layout: One level Home Access: Level entry Bathroom Shower/Tub: Engineer, manufacturing systems: Standard Home Adaptive Equipment: Bedside commode/3-in-1;Wheelchair - manual;Walker - rolling;Tub transfer bench Prior Function Level of Independence: Requires assistive device for independence;Needs assistance with ADLs;Needs assistance with homemaking Comments: Per daughter and sister, great granddaughter has been assisting with ADL for at least a year now to make sure patient did everything thoroughly. They have been concerned that patient has not been able to perform all hygeine adequately so great granddaughter has been helping for this thoroughness.  ADL ADL Eating/Feeding: Simulated;Set up Eating/Feeding Details (indicate cue type and reason): min verbal cues to pay attention to straw direction to drink from cup. Where Assessed - Eating/Feeding: Chair Grooming: Simulated;Set  up;Supervision/safety Where Assessed - Grooming: Sitting, bed;Unsupported Upper Body Bathing:  Simulated;Chest;Right arm;Left arm;Abdomen;Minimal assistance Upper Body Bathing Details (indicate cue type and reason): pt with very limited ROM of shoulders and with weakness Where Assessed - Upper Body Bathing: Sitting, bed;Unsupported Lower Body Bathing: Simulated;+2 Total assistance;Comment for patient % Lower Body Bathing Details (indicate cue type and reason): 30% Where Assessed - Lower Body Bathing: Sit to stand from bed Upper Body Dressing: Simulated;Moderate assistance Upper Body Dressing Details (indicate cue type and reason): with gown. very limited use of shoulders and multiple lines Where Assessed - Upper Body Dressing: Sitting, bed;Unsupported Lower Body Dressing: Simulated;+2 Total assistance;Comment for patient % Lower Body Dressing Details (indicate cue type and reason): pt 0% Where Assessed - Lower Body Dressing: Sit to stand from bed Toilet Transfer: Simulated;+2 Total assistance;Comment for patient % Toilet Transfer Details (indicate cue type and reason): pt +2total assist 20% for sit to stand and +2 total pt 40% to transfer to recliner. Very forward flexed over RW. Requires constant verbal cues and manual assist to help stand more erect and for balance support.  Toilet Transfer Method: Stand pivot Toileting - Clothing Manipulation: +2 Total assistance;Comment for patient % Toileting - Clothing Manipulation Details (indicate cue type and reason): pt 0% Where Assessed - Glass blower/designer Manipulation: Standing Toileting - Hygiene: Simulated;+2 Total assistance;Comment for patient % Toileting - Hygiene Details (indicate cue type and reason): pt 0% Where Assessed - Toileting Hygiene: Standing Tub/Shower Transfer: Not assessed Tub/Shower Transfer Method: Not assessed Equipment Used: Rolling walker ADL Comments: Family present for Ot eval. Cotx with PT. Pt limited this date by  decreased BP and increased pain. Pt to receive blood per nursing and nursing aware of low BP and will stay with patient as blood is started. Encouraged patient to raise arms while in bed or chair to help increase strength.  Vision/Perception    Cognition Cognition Arousal/Alertness: Awake/alert Overall Cognitive Status: Impaired (increased time to follow commands) Orientation Level: Oriented to person;Oriented to place;Disoriented to time;Oriented to situation Sensation/Coordination Sensation Light Touch: Appears Intact Extremity Assessment RUE Assessment RUE Assessment:  (elbow distal WFL for ROM but weak grasp) RUE AROM (degrees) Right Shoulder Flexion  0-170: 25 Degrees RUE Strength RUE Overall Strength: Deficits;Other (Comment) (Per family difficulty with UEs for awhile. Note crepitus) LUE Assessment LUE Assessment: Exceptions to [facility] LUE AROM (degrees) Left Shoulder Flexion  0-170: 25 Degrees LUE Strength LUE Overall Strength: Deficits Mobility  Bed Mobility Bed Mobility: Yes Supine to Sit: 1: +2 Total assist Supine to Sit Details (indicate cue type and reason): pt 50% with multimodal cues for technique Sitting - Scoot to Edge of Bed: 3: Mod assist Sitting - Scoot to Edge of Bed Details (indicate cue type and reason): using pad and cues for technique Transfers Sit to Stand: 1: +2 Total assist;Patient percentage (comment);With upper extremity assist;From bed Sit to Stand Details (indicate cue type and reason): 20% with increased time. Pt unable to stand up straight. Forward flexed over RW. Needed assist to properly place right foot under her for better weight shift.  Stand to Sit: 1: +2 Total assist;Patient percentage (comment);To chair/3-in-1 Stand to Sit Details: pt 30% Exercises   End of Session OT - End of Session Equipment Utilized During Treatment: Gait belt;Other (comment) (RW) Activity Tolerance: Patient limited by fatigue;Patient limited by pain Patient left: in  chair;with call bell in reach;with family/visitor present Nurse Communication: Mobility status for transfers General Behavior During Session: [facility] for tasks performed Cognition: Impaired   Lennox Laity [phone] 04/28/2011, 12:46 PM

## 2011-04-28 NOTE — Progress Notes (Signed)
Physical Therapy Treatment Patient Details Name: Joanne Valdez MRN: [id number] DOB: 1926/04/15 Today's Date: 04/28/2011  PT Assessment/Plan  PT - Assessment/Plan Comments on Treatment Session: Patient to recieve PRBC today.  RN informed patient with decreased BP.  She will monitor as she starts transfusion.  Will likely need STSNF at d/c. PT Plan: Discharge plan remains appropriate PT Frequency: 7X/week Follow Up Recommendations: Skilled nursing facility Equipment Recommended: Defer to next venue PT Goals  Acute Rehab PT Goals Pt will go Supine/Side to Sit: with min assist PT Goal: Supine/Side to Sit - Progress: Progressing toward goal Pt will go Sit to Stand: with min assist PT Goal: Sit to Stand - Progress: Progressing toward goal Pt will go Stand to Sit: with min assist PT Goal: Stand to Sit - Progress: Progressing toward goal  PT Treatment Precautions/Restrictions  Precautions Precautions: Knee Required Braces or Orthoses: Yes Knee Immobilizer: Discontinue once straight leg raise with < 10 degree lag Restrictions Weight Bearing Restrictions: Yes LLE Weight Bearing: Weight bearing as tolerated Mobility (including Balance) Bed Mobility Bed Mobility: Yes Supine to Sit: 1: +2 Total assist Supine to Sit Details (indicate cue type and reason): pt=50%, multimodal cues for moving legs Sitting - Scoot to Edge of Bed: 3: Mod assist Sitting - Scoot to Edge of Bed Details (indicate cue type and reason): cues for technique and pulling on pad under patient Transfers Sit to Stand: 1: +2 Total assist;From bed;With upper extremity assist Sit to Stand Details (indicate cue type and reason): pt=20% increased time, assist for right foot in place for improved weight bearing and forward weight shift.  Slow to rise and unable to extend hips/trunk Stand to Sit: 1: +2 Total assist;To chair/3-in-1;With upper extremity assist;With armrests Stand to Sit Details: pt=30%, assist for left leg out, to  reach for chair and mod/max cues Stand Pivot Transfers: 1: +2 Total assist Stand Pivot Transfer Details (indicate cue type and reason): pt=40%, assist for weight shift, cues for steps to turn, assist for upright posture  Posture/Postural Control Posture/Postural Control: Postural limitations Postural Limitations: due to weakness with Hgb 7.5, BP 75/37 after up to chair. (RN aware) Exercise    End of Session PT - End of Session Equipment Utilized During Treatment: Gait belt;Left knee immobilizer Activity Tolerance: Patient limited by fatigue;Patient limited by pain Patient left: in chair;with call bell in reach;with family/visitor present Nurse Communication: Mobility status for transfers (Low BP) General Behavior During Session: [facility] for tasks performed Cognition: Impaired (pt oriented but took increased cues to follow commands)  Salem Lembke,CYNDI 04/28/2011, 12:16 PM

## 2011-04-28 NOTE — Progress Notes (Signed)
CSW met with pt / family to assist with d/c planning. SNF bed offers provided and pt Joanne Valdez will review offers and choose a facility. CSW will assist with d/c planning to SNF.

## 2011-04-28 NOTE — Progress Notes (Signed)
Physical Therapy Treatment Patient Details Name: Joanne Valdez MRN: [id number] DOB: [birth year] Today's Date: 04/28/2011  PT Assessment/Plan  PT - Assessment/Plan Comments on Treatment Session: Patient fearful to bend the knee, but better with foot on the floor.  Will need intensive rehab at [facility] for best outcome. PT Plan: Discharge plan remains appropriate PT Frequency: 7X/week Follow Up Recommendations: Skilled nursing facility Equipment Recommended: Defer to next venue PT Goals  Acute Rehab PT Goals Pt will go Sit to Supine/Side: with min assist PT Goal: Sit to Supine/Side - Progress: Progressing toward goal Pt will go Sit to Stand: with min assist PT Goal: Sit to Stand - Progress: Progressing toward goal Pt will go Stand to Sit: with min assist PT Goal: Stand to Sit - Progress: Progressing toward goal Pt will Perform Home Exercise Program: with min assist PT Goal: Perform Home Exercise Program - Progress: Progressing toward goal  PT Treatment Precautions/Restrictions  Precautions Precautions: Knee Required Braces or Orthoses: Yes Knee Immobilizer: On when out of bed or walking Restrictions Weight Bearing Restrictions: Yes LLE Weight Bearing: Weight bearing as tolerated Mobility (including Balance) Bed Mobility Sit to Supine: 1: +2 Total assist;HOB flat Sit to Supine - Details (indicate cue type and reason): pt=15% Transfers Sit to Stand: From chair/3-in-1;With armrests;1: +2 Total assist;With upper extremity assist Sit to Stand Details (indicate cue type and reason): pt=30-40%, cues for forward weight shift and assist for placing right foot under her Stand to Sit: 1: +2 Total assist;To bed Stand to Sit Details: pt=30% Stand Pivot Transfers: 1: +2 Total assist Stand Pivot Transfer Details (indicate cue type and reason): with rolling walker, pt able to take 2-3 small steps pt=30-40%.  Needed to move chair out from under her and get the bed closer due to her inablility to  move.  Patient very fearful of pain  Posture/Postural Control Posture/Postural Control: Postural limitations Postural Limitations: still unable to straighten up in standing, but improved with assist of family encouragement Exercise  Total Joint Exercises Ankle Circles/Pumps: AROM;10 reps;Seated;Both Quad Sets: AROM;Left;10 reps;Seated Heel Slides: AAROM;Seated;10 reps Straight Leg Raises: AAROM;Left;10 reps;Seated Knee Flexion: AAROM;Left;10 reps;Seated (with foot on the floor) End of Session PT - End of Session Equipment Utilized During Treatment: Gait belt;Left knee immobilizer Activity Tolerance: Patient limited by pain;Patient limited by fatigue Patient left: in bed;with call bell in reach;with family/visitor present General Behavior During Session: [facility] for tasks performed Cognition: Impaired  Messiah Ahr,CYNDI 04/28/2011, 4:41 PM

## 2011-04-28 NOTE — Progress Notes (Signed)
Subjective: 2 Days Post-Op Procedure(s) (LRB): TOTAL KNEE ARTHROPLASTY (Left)   Patient reports pain as mild. Some confusion, but clears in the afternoon. No other events.  Objective:   VITALS:   Filed Vitals:   04/28/11 0914  BP: 96/53  Pulse: 102  Temp:   Resp: 20    Neurovascular intact Dorsiflexion/Plantar flexion intact Incision: dressing C/D/I No cellulitis present Compartment soft  LABS  Basename 04/28/11 0315 04/27/11 0315 04/26/11 1600  HGB 7.5* 8.5* 9.4*  HCT 22.5* 26.4* 29.1*  WBC 11.7* 11.5* 7.5  PLT 201 214 47*     Basename 04/28/11 0315 04/27/11 0315 04/26/11 1600  NA 133* 138 140  K 4.7 4.4 4.1  BUN 18 26* 32*  CREATININE 1.12* 1.26* 1.33*  GLUCOSE 124* 143* 82     Assessment/Plan: 2 Days Post-Op Procedure(s) (LRB): TOTAL KNEE ARTHROPLASTY (Left)  DO to the low H/H, will transfuse to units of PRBC. Advance diet Up with therapy When pt is ready the plan is for d/c to SNF   Anastasio Auerbach. Sherlie Boyum   PAC  04/28/2011, 10:25 AM

## 2011-04-29 DIAGNOSIS — R269 Unspecified abnormalities of gait and mobility: Secondary | ICD-10-CM | POA: Diagnosis not present

## 2011-04-29 DIAGNOSIS — N289 Disorder of kidney and ureter, unspecified: Secondary | ICD-10-CM | POA: Diagnosis not present

## 2011-04-29 DIAGNOSIS — D126 Benign neoplasm of colon, unspecified: Secondary | ICD-10-CM | POA: Diagnosis not present

## 2011-04-29 DIAGNOSIS — D638 Anemia in other chronic diseases classified elsewhere: Secondary | ICD-10-CM | POA: Diagnosis not present

## 2011-04-29 DIAGNOSIS — Z5189 Encounter for other specified aftercare: Secondary | ICD-10-CM | POA: Diagnosis not present

## 2011-04-29 DIAGNOSIS — R0989 Other specified symptoms and signs involving the circulatory and respiratory systems: Secondary | ICD-10-CM | POA: Diagnosis not present

## 2011-04-29 DIAGNOSIS — Z7982 Long term (current) use of aspirin: Secondary | ICD-10-CM | POA: Diagnosis not present

## 2011-04-29 DIAGNOSIS — R279 Unspecified lack of coordination: Secondary | ICD-10-CM | POA: Diagnosis not present

## 2011-04-29 DIAGNOSIS — K5900 Constipation, unspecified: Secondary | ICD-10-CM | POA: Diagnosis not present

## 2011-04-29 DIAGNOSIS — I10 Essential (primary) hypertension: Secondary | ICD-10-CM | POA: Diagnosis not present

## 2011-04-29 DIAGNOSIS — E785 Hyperlipidemia, unspecified: Secondary | ICD-10-CM | POA: Diagnosis not present

## 2011-04-29 DIAGNOSIS — D62 Acute posthemorrhagic anemia: Secondary | ICD-10-CM

## 2011-04-29 DIAGNOSIS — Z4801 Encounter for change or removal of surgical wound dressing: Secondary | ICD-10-CM | POA: Diagnosis not present

## 2011-04-29 DIAGNOSIS — R262 Difficulty in walking, not elsewhere classified: Secondary | ICD-10-CM | POA: Diagnosis not present

## 2011-04-29 DIAGNOSIS — E669 Obesity, unspecified: Secondary | ICD-10-CM | POA: Diagnosis not present

## 2011-04-29 DIAGNOSIS — M25569 Pain in unspecified knee: Secondary | ICD-10-CM | POA: Diagnosis not present

## 2011-04-29 DIAGNOSIS — M899 Disorder of bone, unspecified: Secondary | ICD-10-CM | POA: Diagnosis not present

## 2011-04-29 DIAGNOSIS — E1129 Type 2 diabetes mellitus with other diabetic kidney complication: Secondary | ICD-10-CM | POA: Diagnosis not present

## 2011-04-29 DIAGNOSIS — M069 Rheumatoid arthritis, unspecified: Secondary | ICD-10-CM | POA: Diagnosis not present

## 2011-04-29 DIAGNOSIS — M6281 Muscle weakness (generalized): Secondary | ICD-10-CM | POA: Diagnosis not present

## 2011-04-29 DIAGNOSIS — Z905 Acquired absence of kidney: Secondary | ICD-10-CM | POA: Diagnosis not present

## 2011-04-29 DIAGNOSIS — Z471 Aftercare following joint replacement surgery: Secondary | ICD-10-CM | POA: Diagnosis not present

## 2011-04-29 DIAGNOSIS — R05 Cough: Secondary | ICD-10-CM | POA: Diagnosis not present

## 2011-04-29 DIAGNOSIS — S99929A Unspecified injury of unspecified foot, initial encounter: Secondary | ICD-10-CM | POA: Diagnosis not present

## 2011-04-29 DIAGNOSIS — H409 Unspecified glaucoma: Secondary | ICD-10-CM | POA: Diagnosis not present

## 2011-04-29 DIAGNOSIS — Z96659 Presence of unspecified artificial knee joint: Secondary | ICD-10-CM | POA: Diagnosis not present

## 2011-04-29 DIAGNOSIS — S8990XA Unspecified injury of unspecified lower leg, initial encounter: Secondary | ICD-10-CM | POA: Diagnosis not present

## 2011-04-29 DIAGNOSIS — M109 Gout, unspecified: Secondary | ICD-10-CM | POA: Diagnosis not present

## 2011-04-29 DIAGNOSIS — E119 Type 2 diabetes mellitus without complications: Secondary | ICD-10-CM | POA: Diagnosis not present

## 2011-04-29 LAB — TYPE AND SCREEN

## 2011-04-29 LAB — HEMOGLOBIN AND HEMATOCRIT, BLOOD: Hemoglobin: 9.4 g/dL — ABNORMAL LOW (ref 12.0–15.0)

## 2011-04-29 LAB — GLUCOSE, CAPILLARY: Glucose-Capillary: 142 mg/dL — ABNORMAL HIGH (ref 70–99)

## 2011-04-29 MED ORDER — DSS 100 MG PO CAPS: 100.0000 mg | ORAL_CAPSULE | Freq: Two times a day (BID) | ORAL | Status: AC

## 2011-04-29 MED ORDER — HYDROCODONE-ACETAMINOPHEN 7.5-325 MG PO TABS: 1.0000 | ORAL_TABLET | ORAL | Status: AC

## 2011-04-29 MED ORDER — ASPIRIN EC 325 MG PO TBEC: 325.0000 mg | DELAYED_RELEASE_TABLET | Freq: Two times a day (BID) | ORAL | Status: AC

## 2011-04-29 MED ORDER — FERROUS SULFATE 325 (65 FE) MG PO TABS: 325.0000 mg | ORAL_TABLET | Freq: Three times a day (TID) | ORAL | Status: DC

## 2011-04-29 MED ORDER — METHOCARBAMOL 500 MG PO TABS: 500.0000 mg | ORAL_TABLET | Freq: Four times a day (QID) | ORAL | Status: AC | PRN

## 2011-04-29 MED ORDER — POLYETHYLENE GLYCOL 3350 17 G PO PACK: 17.0000 g | PACK | Freq: Two times a day (BID) | ORAL | Status: AC

## 2011-04-29 MED ORDER — INSULIN ASPART 100 UNIT/ML ~~LOC~~ SOLN
0.0000 [IU] | Freq: Three times a day (TID) | SUBCUTANEOUS | Status: DC
  Administered 2011-04-29: 1 [IU] via SUBCUTANEOUS
  Filled 2011-04-29: qty 3

## 2011-04-29 NOTE — Progress Notes (Signed)
Physical Therapy Treatment Patient Details Name: Joanne Valdez MRN: [id number] DOB: 1926/05/17 Today's Date: 04/29/2011  1312-1340 2TE  PT Assessment/Plan  PT - Assessment/Plan Comments on Treatment Session: Pt required max encouragement and cues to complete exercises.  Family present to assist with motivating pt to perform exercises, however requires max assist from therapist.  Issued incentive spirometer per orders (pt did not have one) and educated pt/family on use.  Pt to D/C this pm to SNF.  PT Plan: Discharge plan remains appropriate PT Frequency: 7X/week Follow Up Recommendations: Skilled nursing facility Equipment Recommended: Defer to next venue PT Goals  Acute Rehab PT Goals PT Goal Formulation: With patient Time For Goal Achievement: 2 weeks Pt will Perform Home Exercise Program: with min assist PT Goal: Perform Home Exercise Program - Progress: Progressing toward goal  PT Treatment Precautions/Restrictions  Precautions Precautions: Knee Required Braces or Orthoses: Yes Knee Immobilizer: On when out of bed or walking Restrictions Weight Bearing Restrictions: Yes LLE Weight Bearing: Weight bearing as tolerated Mobility (including Balance)      Exercise  Total Joint Exercises Ankle Circles/Pumps: AAROM;Both;15 reps;Seated Quad Sets: AROM;Left;10 reps;Seated Short Arc Quad: AAROM;Left;10 reps;Seated Heel Slides: AAROM;Left;10 reps;Seated Hip ABduction/ADduction: AAROM;Left;10 reps;Seated Straight Leg Raises: AAROM;Left;10 reps;Seated End of Session PT - End of Session Activity Tolerance: Patient limited by fatigue Patient left: in chair;with call bell in reach;with family/visitor present General Behavior During Session: Lethargic Cognition: Impaired  Page, Meribeth Mattes 04/29/2011, 2:42 PM

## 2011-04-29 NOTE — Clinical Documentation Improvement (Signed)
Anemia Blood Loss Clarification  THIS DOCUMENT IS NOT A PERMANENT PART OF THE MEDICAL RECORD  RESPOND TO THE THIS QUERY, FOLLOW THE INSTRUCTIONS BELOW:  1. If needed, update documentation for the patient's encounter via the notes activity.  2. Access this query again and click edit on the In [employer].  3. After updating, or not, click F2 to complete all highlighted (required) fields concerning your review. Select "additional documentation in the medical record" OR "no additional documentation provided".  4. Click Sign note button.  5. The deficiency will fall out of your In Basket *Please let us know if you are not able to complete this workflow by phone or e-mail (listed below).        04/29/11  Dear Dr. Luna Fuse and /or Associates  In an effort to better capture your patient's severity of illness, reflect appropriate length of stay and utilization of resources, a review of the patient medical record has revealed the following indicators.    Based on your clinical judgment, please clarify and document in a progress note and/or discharge summary the clinical condition associated with the following supporting information:  In responding to this query please exercise your independent judgment.  The fact that a query is asked, does not imply that any particular answer is desired or expected.  Per DC Summ.Marland KitchenMarland Kitchen"Do to the low H&H and the confusion she was transfused with 2 units of blood.".Marland KitchenMarland KitchenFor accurate DX specificity & severity can noted documentation be further clarifed with possible, probable, suspected or likely condition. Thank you    Possible Clinical Conditions?  " Expected Acute Blood Loss Anemia  " Acute Blood Loss Anemia " Acute on chronic blood loss anemia  " Chronic blood loss anemia  " Precipitous drop in Hematocrit  " Other Condition________________  " Cannot Clinically Determine    Supporting Information: Risk Factors:  s/p *S/P left knee replacement  04/26/11  Signs and Symptoms: per DC Summ.Marland KitchenMarland Kitchen"Do to the low H&H and the confusion she was transfused with 2 units of blood.".Marland KitchenMarland Kitchen  Diagnostics: per DC summ: 04/28/11 0315  HGB 7.5*  HCT 22.5   Treatments: see above note Transfusion: see above note  Medications: per DC Summ..."ferrous sulfate 325 (65 FE) MG tablet Take 1 tablet (325 mg total) by mouth 3 (three) times daily with meals."   Reviewed:  no additional documentation provided: 05/02/11 DC summ rev'd, no add info noted. ORM  Thank You,  Toribio Harbour, RN, BSN, CCDS Certified Clinical Documentation Specialist Pager: [phone]  Health Information Management [place]

## 2011-04-29 NOTE — Progress Notes (Signed)
Physical Therapy Treatment Patient Details Name: Joanne Valdez MRN: [id number] DOB: [birth year] Today's Date: 04/29/2011  [phone] G, TA  PT Assessment/Plan  PT - Assessment/Plan Comments on Treatment Session: Pt tolerated some ambulation during session, however requires +2 for upright posture and LE advancement.  Continue to recommend SNF for follow up therapy at D/C PT Plan: Discharge plan remains appropriate PT Frequency: 7X/week Follow Up Recommendations: Skilled nursing facility Equipment Recommended: Defer to next venue PT Goals  Acute Rehab PT Goals PT Goal Formulation: With patient Time For Goal Achievement: 2 weeks Pt will go Supine/Side to Sit: with min assist PT Goal: Supine/Side to Sit - Progress: Progressing toward goal Pt will go Sit to Stand: with min assist PT Goal: Sit to Stand - Progress: Progressing toward goal Pt will go Stand to Sit: with min assist PT Goal: Stand to Sit - Progress: Progressing toward goal Pt will Ambulate: 16 - 50 feet;with min assist;with rolling walker PT Goal: Ambulate - Progress: Progressing toward goal  PT Treatment Precautions/Restrictions  Precautions Precautions: Knee Required Braces or Orthoses: Yes Knee Immobilizer: On when out of bed or walking Restrictions Weight Bearing Restrictions: Yes LLE Weight Bearing: Weight bearing as tolerated Mobility (including Balance) Bed Mobility Bed Mobility: Yes Supine to Sit: 1: +2 Total assist Supine to Sit Details (indicate cue type and reason): Pt assist 40% with constant cuing for technique Sitting - Scoot to Edge of Bed: 1: +2 Total assist;Patient percentage (comment) Sitting - Scoot to Edge of Bed Details (indicate cue type and reason): Pt assist 50% with PT using pad and cues for technique.  Transfers Transfers: Yes Sit to Stand: 1: +2 Total assist;Patient percentage (comment);With upper extremity assist;From bed;With armrests;From chair/3-in-1 Sit to Stand Details (indicate cue  type and reason): performed x 2 from bed and 3in1.  Pt assist 30% with constant verbal and manual cuing for forward weight shift and glute activation to maintain standing position.    Stand to Sit: 1: +2 Total assist;Patient percentage (comment);With upper extremity assist;To chair/3-in-1;With armrests Stand to Sit Details: Performed x 2 to 3in1 and to chair.  +2 assist for controlled descent and for LLE management.  cues for hand placement and LLE management.  Ambulation/Gait Ambulation/Gait: Yes Ambulation/Gait Assistance: 1: +2 Total assist;Patient percentage (comment) (Pt assist 40%) Ambulation/Gait Assistance Details (indicate cue type and reason): Requires +2 assist to maintain upright position with constant verbal and manual cuing for weight shifting, glute activation, and LLE advancement.   Ambulation Distance (Feet): 15 Feet Assistive device: Rolling walker Gait Pattern: Step-to pattern;Decreased stride length;Decreased weight shift to right;Trunk flexed Gait velocity: slow  Posture/Postural Control Posture/Postural Control: Postural limitations Postural Limitations: Pt continues to have forward flexed posture with standing.  Can correct with cuing and encouragement from family.  Exercise    End of Session PT - End of Session Equipment Utilized During Treatment: Gait belt;Left knee immobilizer Activity Tolerance: Patient limited by pain;Patient limited by fatigue Patient left: in chair;with call bell in reach;with family/visitor present General Behavior During Session: [facility] for tasks performed Cognition: Impaired (Pt requires increased cues to follow commands)  Page, Meribeth Mattes 04/29/2011, 11:25 AM

## 2011-04-29 NOTE — Discharge Summary (Signed)
Patient ID: Joanne Valdez  MRN: [id number]  DOB/AGE: Jul 30, 1926 78 y.o.   Admit date: 04/26/2011   Discharge date: 04/29/2011   Procedures:  Procedure(s) (LRB):  TOTAL KNEE ARTHROPLASTY (Left)   Attending Physician: Shelda Pal, MD   Admission Diagnoses: Left knee OA and pain   Discharge Diagnoses:  Principal Problem:  *S/P left knee replacement  Diabetes mellitus  Obesity  Hypertension  Gout  Dyslipidemia  Renal insufficiency  Arthritis - RHEUMATOID  Glaucoma  Colonic polyp   HPI: Pt is a 78 y.o. female complaining of left knee pain for a couple of years. Pain had continually increased since the beginning. X-rays in the clinic show end-stage arthritic changes of the left knee, with some bone-on-bone changes. Pt has tried various conservative treatments which have failed to alleviate their symptoms, including NSAIDs and steroid injections. Various options are discussed with the patient. Risks, benefits and expectations were discussed with the patient. Patient understand the risks, benefits and expectations and wishes to proceed with surgery. Pt has been seeing Dr. Eden Emms whom has to have a final clearance for the pt, once cleared we will proceed with the surgery.   PCP: Carollee Herter, MD, MD   Discharged Condition: good   Hospital Course: Patient underwent the above stated procedure on 04/26/2011. Patient tolerated the procedure well and brought to the recovery room in good condition and subsequently to the floor.   POD #1  BP: 111/60 ; Pulse: 71 ; Temp: 98.1 F (36.7 C) ; Resp: 18  Pt's foley was removed, as well as the hemovac drain removed. IV was changed to a saline lock. Patient reports pain as mild. No events.  Neurovascular intact, dorsiflexion/plantar flexion intact, incision: dressing C/D/I, no cellulitis present and compartment soft.   LABS  Basename  04/27/11 0315     HGB  8.5*     HCT  26.4*      POD #2  BP: 96/53 ; Pulse: 102 ; Resp: 20  Patient  reports pain as mild. Some confusion, but clears in the afternoon. No other events. Do to the low H&H and the confusion she was transfused with 2 units of blood.  Neurovascular intact, dorsiflexion/plantar flexion intact, incision: dressing C/D/I, no cellulitis present and compartment soft.   LABS  Basename  04/28/11 0315     HGB  7.5*     HCT  22.5*      POD #3  BP: 132/83 ; Pulse: 100 ; Temp: 100.7 F (38.2 C) ; Resp: 16  Patient reports pain as mild. Pain only with certain motions. States that she realizes that it is a painful surgery. She does feel that the pain is getting better. She still recognizes that SNF placement would be best for her overall outcome. Feels some better after receiving blood. No other events. Feels ready to be discharged.  Neurovascular intact, dorsiflexion/plantar flexion intact, incision: dressing C/D/I, no cellulitis present and compartment soft.   LABS  Basename  04/29/11 0315     HGB  9.4*     HCT  28.2*        Discharge Exam:  Extremities: Homans sign is negative, no sign of DVT, no edema, redness or tenderness in the calves or thighs and no ulcers, gangrene or trophic changes   Disposition: Home or Self Care with follow up in 2 weeks   Follow-up Information    Follow up with OLIN,Alif Petrak D in 2 weeks.    Contact information:    [facility]  [facility]  [address]  [place] [address]  [phone]   Discharge Orders    Future Appointments: Provider: Department: Dept Phone: Center:   05/11/2011 9:00 AM Wendall Stade, MD [facility] [phone] [facility]     Future Orders Please Complete By Expires   Diet - low sodium heart healthy      Call MD / Call 911      Comments:   If you experience chest pain or shortness of breath, CALL 911 and be transported to the hospital emergency room.  If you develope a fever above 101 F, pus (white drainage) or increased drainage or redness at the wound, or calf  pain, call your surgeon's office.   Discharge instructions      Comments:   Maintain surgical dressing for 8 days, then replace with gauze and tape. Keep the area dry and clean until follow up. Follow up in 2 weeks at [facility]. Call with any questions or concerns.     Constipation Prevention      Comments:   Drink plenty of fluids.  Prune juice may be helpful.  You may use a stool softener, such as Colace (over the counter) 100 mg twice a day.  Use MiraLax (over the counter) for constipation as needed.   Increase activity slowly as tolerated      Weight Bearing as taught in Physical Therapy      Comments:   Use a walker or crutches as instructed.   Driving restrictions      Comments:   No driving for 4 weeks   TED hose      Comments:   Use stockings (TED hose) for 2 weeks on both leg(s).  You may remove them at night for sleeping.   Change dressing      Comments:   Maintain surgical dressing for 8 days, then change the dressing daily with sterile 4 x 4 inch gauze dressing and tape. Keep the area dry and clean.   CPM      Scheduling Instructions:   For use at SNF.  0-90 advance 10 degrees per day as tolerated. If not able to start at 90 start where tolerated and advance. 4-6 hours a day.      Current Discharge Medication List    START taking these medications   Details  aspirin EC 325 MG tablet Take 1 tablet (325 mg total) by mouth 2 (two) times daily. X 4 weeks Qty: 60 tablet, Refills: 0    docusate sodium 100 MG CAPS Take 100 mg by mouth 2 (two) times daily.    HYDROcodone-acetaminophen (NORCO) 7.5-325 MG per tablet Take 1-2 tablets by mouth every 4 (four) hours. Qty: 120 tablet, Refills: 0    methocarbamol (ROBAXIN) 500 MG tablet Take 1 tablet (500 mg total) by mouth every 6 (six) hours as needed (muscle spasms). Qty: 50 tablet, Refills: 0    polyethylene glycol (MIRALAX / GLYCOLAX) packet Take 17 g by mouth 2 (two) times daily.      CONTINUE these  medications which have CHANGED   Details  ferrous sulfate 325 (65 FE) MG tablet Take 1 tablet (325 mg total) by mouth 3 (three) times daily with meals.      CONTINUE these medications which have NOT CHANGED   Details  allopurinol (ZYLOPRIM) 300 MG tablet Take 300 mg by mouth daily.     amLODipine (NORVASC) 10 MG tablet Take 10 mg by mouth daily with breakfast.  atorvastatin (LIPITOR) 40 MG tablet Take 40 mg by mouth daily with breakfast.     Cholecalciferol (VITAMIN D3) 1000 UNITS CAPS Take 1,000 mg by mouth daily.     folic acid (FOLVITE) 1 MG tablet Take 1 mg by mouth daily.     lisinopril-hydrochlorothiazide (PRINZIDE,ZESTORETIC) 20-12.5 MG per tablet Take 1 tablet by mouth daily with breakfast.    LUMIGAN 0.01 % SOLN Place 1 drop into both eyes at bedtime.     metFORMIN (GLUCOPHAGE) 500 MG tablet Take 500 mg by mouth daily with breakfast.    methotrexate (RHEUMATREX) 2.5 MG tablet Take 10 mg by mouth once a week. Take on Tuesdays    metoprolol succinate (TOPROL-XL) 50 MG 24 hr tablet Take 1 tablet (50 mg total) by mouth daily. Take with or immediately following a meal. Qty: 30 tablet, Refills: 11    Multiple Vitamin (MULITIVITAMIN WITH MINERALS) TABS Take 1 tablet by mouth daily.     predniSONE (DELTASONE) 5 MG tablet Take 5 mg by mouth daily.       STOP taking these medications     aspirin 81 MG chewable tablet Comments:  Reason for Stopping:          Signed:  Anastasio Auerbach. Yates Weisgerber   PAC  04/29/2011, 11:01 AM

## 2011-04-29 NOTE — Progress Notes (Signed)
Subjective: 3 Days Post-Op Procedure(s) (LRB): TOTAL KNEE ARTHROPLASTY (Left)   Patient reports pain as mild. Pain only with certain motions. States that she realizes that it is a painful surgery. She does feel that the pain is getting better. She still recognizes that SNF placement would be best for her overall outcome. Feels some better after receiving blood. No other events. Feels ready to be discharged.  Objective:   VITALS:   Filed Vitals:   04/29/11 0520  BP: 132/83  Pulse: 100  Temp: 100.7 F (38.2 C)  Resp: 16    Neurovascular intact Dorsiflexion/Plantar flexion intact Incision: dressing C/D/I No cellulitis present Compartment soft  LABS  Basename 04/29/11 0740 04/28/11 0315 04/27/11 0315 04/26/11 1600  HGB 9.4* 7.5* 8.5* --  HCT 28.2* 22.5* 26.4* --  WBC -- 11.7* 11.5* 7.5  PLT -- 201 214 47*      Assessment/Plan: 3 Days Post-Op Procedure(s) (LRB): TOTAL KNEE ARTHROPLASTY (Left)   Up with therapy Discharge to SNF when ready/available   Anastasio Auerbach. Skarlett Sedlacek   PAC  04/29/2011, 8:16 AM

## 2011-04-29 NOTE — Progress Notes (Signed)
Physician Discharge Summary  Patient ID: Joanne Valdez MRN: [id number] DOB/AGE: 76-Aug-1928 76 y.o.  Admit date: 04/26/2011 Discharge date: 04/29/2011  Procedures:  Procedure(s) (LRB): TOTAL KNEE ARTHROPLASTY (Left)  Attending Physician: Shelda Pal, MD   Admission Diagnoses: Left knee OA and pain   Discharge Diagnoses:  Principal Problem:  *S/P left knee replacement Diabetes mellitus   Obesity   Hypertension   Gout   Dyslipidemia   Renal insufficiency   Arthritis  - RHEUMATOID   Glaucoma   Colonic polyp   HPI: Pt is a 78 y.o. female complaining of left knee pain for a couple of years. Pain had continually increased since the beginning. X-rays in the clinic show end-stage arthritic changes of the left knee, with some bone-on-bone changes. Pt has tried various conservative treatments which have failed to alleviate their symptoms, including NSAIDs and steroid injections. Various options are discussed with the patient. Risks, benefits and expectations were discussed with the patient. Patient understand the risks, benefits and expectations and wishes to proceed with surgery. Pt has been seeing Dr. Eden Emms whom has to have a final clearance for the pt, once cleared we will proceed with the surgery.  PCP: Carollee Herter, MD, MD   Discharged Condition: good  Hospital Course:  Patient underwent the above stated procedure on 04/26/2011. Patient tolerated the procedure well and brought to the recovery room in good condition and subsequently to the floor.  POD #1 BP: 111/60 ; Pulse: 71 ; Temp: 98.1 F (36.7 C) ; Resp: 18 Pt's foley was removed, as well as the hemovac drain removed. IV was changed to a saline lock. Patient reports pain as mild. No events. Neurovascular intact, dorsiflexion/plantar flexion intact, incision: dressing C/D/I, no cellulitis present and compartment soft.  LABS  Basename  04/27/11 0315     HGB  8.5*     HCT  26.4*      POD #2  BP: 96/53 ; Pulse: 102  ; Resp: 20  Patient reports pain as mild. Some confusion, but clears in the afternoon. No other events. Do to the low H&H and the confusion she was transfused with 2 units of blood. Neurovascular intact, dorsiflexion/plantar flexion intact, incision: dressing C/D/I, no cellulitis present and compartment soft.  LABS  Basename  04/28/11 0315     HGB  7.5*     HCT  22.5*      POD #3  BP: 132/83 ; Pulse: 100 ; Temp: 100.7 F (38.2 C) ; Resp: 16  Patient reports pain as mild. Pain only with certain motions. States that she realizes that it is a painful surgery. She does feel that the pain is getting better. She still recognizes that SNF placement would be best for her overall outcome. Feels some better after receiving blood. No other events. Feels ready to be discharged. Neurovascular intact, dorsiflexion/plantar flexion intact, incision: dressing C/D/I, no cellulitis present and compartment soft.  LABS  Basename  04/29/11 0315     HGB  9.4*     HCT  28.2*      Discharge Exam: Extremities: Homans sign is negative, no sign of DVT, no edema, redness or tenderness in the calves or thighs and no ulcers, gangrene or trophic changes  Disposition: Home or Self Care     with follow up in 2 weeks  Follow-up Information    Follow up with OLIN,Amine Adelson D in 2 weeks.   Contact information:   [facility] [address] [place]  [address] [phone]          Discharge Orders    Future Appointments: Provider: Department: Dept Phone: Center:   05/11/2011 9:00 AM Wendall Stade, MD [facility] [phone] [facility]     Future Orders Please Complete By Expires   Diet - low sodium heart healthy      Call MD / Call 911      Comments:   If you experience chest pain or shortness of breath, CALL 911 and be transported to the hospital emergency room.  If you develope a fever above 101 F, pus (white drainage) or increased drainage or redness at the  wound, or calf pain, call your surgeon's office.   Discharge instructions      Comments:   Maintain surgical dressing for 8 days, then replace with gauze and tape. Keep the area dry and clean until follow up. Follow up in 2 weeks at [facility]. Call with any questions or concerns.     Constipation Prevention      Comments:   Drink plenty of fluids.  Prune juice may be helpful.  You may use a stool softener, such as Colace (over the counter) 100 mg twice a day.  Use MiraLax (over the counter) for constipation as needed.   Increase activity slowly as tolerated      Weight Bearing as taught in Physical Therapy      Comments:   Use a walker or crutches as instructed.   Driving restrictions      Comments:   No driving for 4 weeks   TED hose      Comments:   Use stockings (TED hose) for 2 weeks on both leg(s).  You may remove them at night for sleeping.   Change dressing      Comments:   Maintain surgical dressing for 8 days, then change the dressing daily with sterile 4 x 4 inch gauze dressing and tape. Keep the area dry and clean.      Current Discharge Medication List    START taking these medications   Details  aspirin EC 325 MG tablet Take 1 tablet (325 mg total) by mouth 2 (two) times daily. X 4 weeks Qty: 60 tablet, Refills: 0    docusate sodium 100 MG CAPS Take 100 mg by mouth 2 (two) times daily.    HYDROcodone-acetaminophen (NORCO) 7.5-325 MG per tablet Take 1-2 tablets by mouth every 4 (four) hours. Qty: 120 tablet, Refills: 0    methocarbamol (ROBAXIN) 500 MG tablet Take 1 tablet (500 mg total) by mouth every 6 (six) hours as needed (muscle spasms). Qty: 50 tablet, Refills: 0    polyethylene glycol (MIRALAX / GLYCOLAX) packet Take 17 g by mouth 2 (two) times daily.      CONTINUE these medications which have CHANGED   Details  ferrous sulfate 325 (65 FE) MG tablet Take 1 tablet (325 mg total) by mouth 3 (three) times daily with meals.      CONTINUE  these medications which have NOT CHANGED   Details  allopurinol (ZYLOPRIM) 300 MG tablet Take 300 mg by mouth daily.     amLODipine (NORVASC) 10 MG tablet Take 10 mg by mouth daily with breakfast.     atorvastatin (LIPITOR) 40 MG tablet Take 40 mg by mouth daily with breakfast.     Cholecalciferol (VITAMIN D3) 1000 UNITS CAPS Take 1,000 mg by mouth daily.     folic acid (FOLVITE) 1 MG tablet Take 1 mg by  mouth daily.     lisinopril-hydrochlorothiazide (PRINZIDE,ZESTORETIC) 20-12.5 MG per tablet Take 1 tablet by mouth daily with breakfast.    LUMIGAN 0.01 % SOLN Place 1 drop into both eyes at bedtime.     metFORMIN (GLUCOPHAGE) 500 MG tablet Take 500 mg by mouth daily with breakfast.    methotrexate (RHEUMATREX) 2.5 MG tablet Take 10 mg by mouth once a week. Take on Tuesdays    metoprolol succinate (TOPROL-XL) 50 MG 24 hr tablet Take 1 tablet (50 mg total) by mouth daily. Take with or immediately following a meal. Qty: 30 tablet, Refills: 11    Multiple Vitamin (MULITIVITAMIN WITH MINERALS) TABS Take 1 tablet by mouth daily.     predniSONE (DELTASONE) 5 MG tablet Take 5 mg by mouth daily.       STOP taking these medications     aspirin 81 MG chewable tablet Comments:  Reason for Stopping:          Signed: Anastasio Auerbach. Tachina Spoonemore   PAC  04/29/2011, 8:35 AM

## 2011-04-29 NOTE — Progress Notes (Signed)
Per MD, Pt ready for d/c.  Notified Danielle at [facility].  Faxed d/c summary.  [facility] ready to receive Pt.  Notified Pt, sister and grandson.  Arranged for transportation.  Pt to be d/c'd.  Providence Crosby, LCSWA Clinical Social Work [phone]

## 2011-05-04 DIAGNOSIS — E1129 Type 2 diabetes mellitus with other diabetic kidney complication: Secondary | ICD-10-CM | POA: Diagnosis not present

## 2011-05-04 DIAGNOSIS — K5900 Constipation, unspecified: Secondary | ICD-10-CM | POA: Diagnosis not present

## 2011-05-04 DIAGNOSIS — M109 Gout, unspecified: Secondary | ICD-10-CM | POA: Diagnosis not present

## 2011-05-04 DIAGNOSIS — M069 Rheumatoid arthritis, unspecified: Secondary | ICD-10-CM | POA: Diagnosis not present

## 2011-05-04 DIAGNOSIS — R05 Cough: Secondary | ICD-10-CM | POA: Diagnosis not present

## 2011-05-04 DIAGNOSIS — E785 Hyperlipidemia, unspecified: Secondary | ICD-10-CM | POA: Diagnosis not present

## 2011-05-04 DIAGNOSIS — I10 Essential (primary) hypertension: Secondary | ICD-10-CM | POA: Diagnosis not present

## 2011-05-09 DIAGNOSIS — D638 Anemia in other chronic diseases classified elsewhere: Secondary | ICD-10-CM | POA: Diagnosis not present

## 2011-05-09 DIAGNOSIS — E1129 Type 2 diabetes mellitus with other diabetic kidney complication: Secondary | ICD-10-CM | POA: Diagnosis not present

## 2011-05-09 DIAGNOSIS — I10 Essential (primary) hypertension: Secondary | ICD-10-CM | POA: Diagnosis not present

## 2011-05-09 DIAGNOSIS — Z96659 Presence of unspecified artificial knee joint: Secondary | ICD-10-CM | POA: Diagnosis not present

## 2011-05-11 ENCOUNTER — Ambulatory Visit: Payer: Medicare Other | Admitting: Cardiovascular Disease

## 2011-05-21 DIAGNOSIS — R269 Unspecified abnormalities of gait and mobility: Secondary | ICD-10-CM | POA: Diagnosis not present

## 2011-05-21 DIAGNOSIS — M109 Gout, unspecified: Secondary | ICD-10-CM | POA: Diagnosis not present

## 2011-05-21 DIAGNOSIS — I10 Essential (primary) hypertension: Secondary | ICD-10-CM | POA: Diagnosis not present

## 2011-05-21 DIAGNOSIS — E119 Type 2 diabetes mellitus without complications: Secondary | ICD-10-CM | POA: Diagnosis not present

## 2011-05-21 DIAGNOSIS — M069 Rheumatoid arthritis, unspecified: Secondary | ICD-10-CM | POA: Diagnosis not present

## 2011-05-21 DIAGNOSIS — Z471 Aftercare following joint replacement surgery: Secondary | ICD-10-CM | POA: Diagnosis not present

## 2011-05-23 DIAGNOSIS — I10 Essential (primary) hypertension: Secondary | ICD-10-CM | POA: Diagnosis not present

## 2011-05-23 DIAGNOSIS — E119 Type 2 diabetes mellitus without complications: Secondary | ICD-10-CM | POA: Diagnosis not present

## 2011-05-23 DIAGNOSIS — R269 Unspecified abnormalities of gait and mobility: Secondary | ICD-10-CM | POA: Diagnosis not present

## 2011-05-23 DIAGNOSIS — Z471 Aftercare following joint replacement surgery: Secondary | ICD-10-CM | POA: Diagnosis not present

## 2011-05-23 DIAGNOSIS — M109 Gout, unspecified: Secondary | ICD-10-CM | POA: Diagnosis not present

## 2011-05-23 DIAGNOSIS — M069 Rheumatoid arthritis, unspecified: Secondary | ICD-10-CM | POA: Diagnosis not present

## 2011-05-24 ENCOUNTER — Ambulatory Visit: Payer: Medicare Other | Admitting: Family Medicine

## 2011-05-24 ENCOUNTER — Telehealth: Payer: Self-pay

## 2011-05-24 DIAGNOSIS — M109 Gout, unspecified: Secondary | ICD-10-CM | POA: Diagnosis not present

## 2011-05-24 DIAGNOSIS — E119 Type 2 diabetes mellitus without complications: Secondary | ICD-10-CM | POA: Diagnosis not present

## 2011-05-24 DIAGNOSIS — M069 Rheumatoid arthritis, unspecified: Secondary | ICD-10-CM | POA: Diagnosis not present

## 2011-05-24 DIAGNOSIS — Z471 Aftercare following joint replacement surgery: Secondary | ICD-10-CM | POA: Diagnosis not present

## 2011-05-24 DIAGNOSIS — I10 Essential (primary) hypertension: Secondary | ICD-10-CM | POA: Diagnosis not present

## 2011-05-24 DIAGNOSIS — R269 Unspecified abnormalities of gait and mobility: Secondary | ICD-10-CM | POA: Diagnosis not present

## 2011-05-24 NOTE — Telephone Encounter (Signed)
A[facility] called wanted an verbal approval  for 2 x a week for 5 weeks OT what do you want me to do

## 2011-05-25 ENCOUNTER — Emergency Department (HOSPITAL_COMMUNITY): Payer: Medicare Other

## 2011-05-25 ENCOUNTER — Emergency Department (HOSPITAL_COMMUNITY)
Admission: EM | Admit: 2011-05-25 | Discharge: 2011-05-25 | Disposition: A | Discharge status: Home / Self Care | Payer: Medicare Other | Attending: Emergency Medicine | Admitting: Emergency Medicine

## 2011-05-25 ENCOUNTER — Encounter (HOSPITAL_COMMUNITY): Payer: Self-pay | Admitting: *Deleted

## 2011-05-25 ENCOUNTER — Other Ambulatory Visit: Payer: Self-pay

## 2011-05-25 DIAGNOSIS — Z7982 Long term (current) use of aspirin: Secondary | ICD-10-CM | POA: Insufficient documentation

## 2011-05-25 DIAGNOSIS — E785 Hyperlipidemia, unspecified: Secondary | ICD-10-CM | POA: Insufficient documentation

## 2011-05-25 DIAGNOSIS — Z87891 Personal history of nicotine dependence: Secondary | ICD-10-CM | POA: Diagnosis not present

## 2011-05-25 DIAGNOSIS — M549 Dorsalgia, unspecified: Secondary | ICD-10-CM | POA: Diagnosis not present

## 2011-05-25 DIAGNOSIS — H409 Unspecified glaucoma: Secondary | ICD-10-CM | POA: Insufficient documentation

## 2011-05-25 DIAGNOSIS — I10 Essential (primary) hypertension: Secondary | ICD-10-CM | POA: Diagnosis not present

## 2011-05-25 DIAGNOSIS — R269 Unspecified abnormalities of gait and mobility: Secondary | ICD-10-CM | POA: Diagnosis not present

## 2011-05-25 DIAGNOSIS — Z9889 Other specified postprocedural states: Secondary | ICD-10-CM | POA: Diagnosis not present

## 2011-05-25 DIAGNOSIS — D649 Anemia, unspecified: Secondary | ICD-10-CM | POA: Diagnosis not present

## 2011-05-25 DIAGNOSIS — Z09 Encounter for follow-up examination after completed treatment for conditions other than malignant neoplasm: Secondary | ICD-10-CM | POA: Diagnosis not present

## 2011-05-25 DIAGNOSIS — D72829 Elevated white blood cell count, unspecified: Secondary | ICD-10-CM | POA: Insufficient documentation

## 2011-05-25 DIAGNOSIS — J9819 Other pulmonary collapse: Secondary | ICD-10-CM | POA: Diagnosis not present

## 2011-05-25 DIAGNOSIS — Z471 Aftercare following joint replacement surgery: Secondary | ICD-10-CM | POA: Diagnosis not present

## 2011-05-25 DIAGNOSIS — M109 Gout, unspecified: Secondary | ICD-10-CM | POA: Diagnosis not present

## 2011-05-25 DIAGNOSIS — M069 Rheumatoid arthritis, unspecified: Secondary | ICD-10-CM | POA: Insufficient documentation

## 2011-05-25 DIAGNOSIS — R079 Chest pain, unspecified: Secondary | ICD-10-CM | POA: Diagnosis not present

## 2011-05-25 DIAGNOSIS — R109 Unspecified abdominal pain: Secondary | ICD-10-CM | POA: Diagnosis not present

## 2011-05-25 DIAGNOSIS — E119 Type 2 diabetes mellitus without complications: Secondary | ICD-10-CM | POA: Diagnosis not present

## 2011-05-25 LAB — BASIC METABOLIC PANEL
CO2: 25 mEq/L (ref 19–32)
Calcium: 12.9 mg/dL — ABNORMAL HIGH (ref 8.4–10.5)
GFR calc non Af Amer: 44 mL/min — ABNORMAL LOW (ref >90)
Glucose, Bld: 133 mg/dL — ABNORMAL HIGH (ref 70–99)
Potassium: 4.2 mEq/L (ref 3.5–5.1)
Sodium: 140 mEq/L (ref 135–145)

## 2011-05-25 LAB — DIFFERENTIAL
Basophils Absolute: 0 10*3/uL (ref 0.0–0.1)
Eosinophils Absolute: 0.1 10*3/uL (ref 0.0–0.7)
Lymphocytes Relative: 10 % — ABNORMAL LOW (ref 12–46)
Lymphs Abs: 1.4 10*3/uL (ref 0.7–4.0)
Neutrophils Relative %: 82 % — ABNORMAL HIGH (ref 43–77)

## 2011-05-25 LAB — URINALYSIS, ROUTINE W REFLEX MICROSCOPIC
Nitrite: NEGATIVE
Protein, ur: NEGATIVE mg/dL
Specific Gravity, Urine: 1.014 (ref 1.005–1.030)
Urobilinogen, UA: 0.2 mg/dL (ref 0.0–1.0)

## 2011-05-25 LAB — CBC
Platelets: 160 10*3/uL (ref 150–400)
RBC: 3.01 MIL/uL — ABNORMAL LOW (ref 3.87–5.11)
RDW: 18 % — ABNORMAL HIGH (ref 11.5–15.5)
WBC: 14.3 10*3/uL — ABNORMAL HIGH (ref 4.0–10.5)

## 2011-05-25 LAB — URINE MICROSCOPIC-ADD ON

## 2011-05-25 LAB — CARDIAC PANEL(CRET KIN+CKTOT+MB+TROPI)
CK, MB: 1.7 ng/mL (ref 0.3–4.0)
Total CK: 49 U/L (ref 7–177)

## 2011-05-25 MED ORDER — FENTANYL CITRATE 0.05 MG/ML IJ SOLN
50.0000 ug | Freq: Once | INTRAMUSCULAR | Status: AC
  Administered 2011-05-25: 50 ug via INTRAVENOUS
  Filled 2011-05-25: qty 2

## 2011-05-25 NOTE — Telephone Encounter (Signed)
Called Joanne Valdez and told her that [facility] said it was ok foe 2 x week for 5 weeks ot

## 2011-05-25 NOTE — Discharge Instructions (Signed)
Back Pain, Adult Low back pain is very common. About 1 in 5 people have back pain.The cause of low back pain is rarely dangerous. The pain often gets better over time.About half of people with a sudden onset of back pain feel better in just 2 weeks. About 8 in 10 people feel better by 6 weeks.  CAUSES Some common causes of back pain include:  Strain of the muscles or ligaments supporting the spine.   Wear and tear (degeneration) of the spinal discs.   Arthritis.   Direct injury to the back.  DIAGNOSIS Most of the time, the direct cause of low back pain is not known.However, back pain can be treated effectively even when the exact cause of the pain is unknown.Answering your caregiver's questions about your overall health and symptoms is one of the most accurate ways to make sure the cause of your pain is not dangerous. If your caregiver needs more information, he or she may order lab work or imaging tests (X-rays or MRIs).However, even if imaging tests show changes in your back, this usually does not require surgery. HOME CARE INSTRUCTIONS For many people, back pain returns.Since low back pain is rarely dangerous, it is often a condition that people can learn to manageon their own.   Remain active. It is stressful on the back to sit or stand in one place. Do not sit, drive, or stand in one place for more than 30 minutes at a time. Take short walks on level surfaces as soon as pain allows.Try to increase the length of time you walk each day.   Do not stay in bed.Resting more than 1 or 2 days can delay your recovery.   Do not avoid exercise or work.Your body is made to move.It is not dangerous to be active, even though your back may hurt.Your back will likely heal faster if you return to being active before your pain is gone.   Pay attention to your body when you bend and lift. Many people have less discomfortwhen lifting if they bend their knees, keep the load close to their  bodies,and avoid twisting. Often, the most comfortable positions are those that put less stress on your recovering back.   Find a comfortable position to sleep. Use a firm mattress and lie on your side with your knees slightly bent. If you lie on your back, put a pillow under your knees.   Only take over-the-counter or prescription medicines as directed by your caregiver. Over-the-counter medicines to reduce pain and inflammation are often the most helpful.Your caregiver may prescribe muscle relaxant drugs.These medicines help dull your pain so you can more quickly return to your normal activities and healthy exercise.   Put ice on the injured area.   Put ice in a plastic bag.   Place a towel between your skin and the bag.   Leave the ice on for 15 to 20 minutes, 3 to 4 times a day for the first 2 to 3 days. After that, ice and heat may be alternated to reduce pain and spasms.   Ask your caregiver about trying back exercises and gentle massage. This may be of some benefit.   Avoid feeling anxious or stressed.Stress increases muscle tension and can worsen back pain.It is important to recognize when you are anxious or stressed and learn ways to manage it.Exercise is a great option.  SEEK MEDICAL CARE IF:  You have pain that is not relieved with rest or medicine.   You have   pain that does not improve in 1 week.   You have new symptoms.   You are generally not feeling well.  SEEK IMMEDIATE MEDICAL CARE IF:   You have pain that radiates from your back into your legs.   You develop new bowel or bladder control problems.   You have unusual weakness or numbness in your arms or legs.   You develop nausea or vomiting.   You develop abdominal pain.   You feel faint.  Document Released: 03/14/2005 Document Revised: 11/24/2010 Document Reviewed: 08/02/2010 [facility] Patient Information 2012 [place], [place].Chest Pain (Nonspecific) It is often hard to give a specific diagnosis for the  cause of chest pain. There is always a chance that your pain could be related to something serious, such as a heart attack or a blood clot in the lungs. You need to follow up with your caregiver for further evaluation. CAUSES   Heartburn.   Pneumonia or bronchitis.   Anxiety and stress.   Inflammation around your heart (pericarditis) or lung (pleuritis or pleurisy).   A blood clot in the lung.   A collapsed lung (pneumothorax). It can develop suddenly on its own (spontaneous pneumothorax) or from injury (trauma) to the chest.  The chest wall is composed of bones, muscles, and cartilage. Any of these can be the source of the pain.  The bones can be bruised by injury.   The muscles or cartilage can be strained by coughing or overwork.   The cartilage can be affected by inflammation and become sore (costochondritis).  DIAGNOSIS  Lab tests or other studies, such as X-rays, an EKG, stress testing, or cardiac imaging, may be needed to find the cause of your pain.  TREATMENT   Treatment depends on what may be causing your chest pain. Treatment may include:   Acid blockers for heartburn.   Anti-inflammatory medicine.   Pain medicine for inflammatory conditions.   Antibiotics if an infection is present.   You may be advised to change lifestyle habits. This includes stopping smoking and avoiding caffeine and chocolate.   You may be advised to keep your head raised (elevated) when sleeping. This reduces the chance of acid going backward from your stomach into your esophagus.   Most of the time, nonspecific chest pain will improve within 2 to 3 days with rest and mild pain medicine.  HOME CARE INSTRUCTIONS   If antibiotics were prescribed, take the full amount even if you start to feel better.   For the next few days, avoid physical activities that bring on chest pain. Continue physical activities as directed.   Do not smoke cigarettes or drink alcohol until your symptoms are gone.     Only take over-the-counter or prescription medicine for pain, discomfort, or fever as directed by your caregiver.   Follow your caregiver's suggestions for further testing if your chest pain does not go away.   Keep any follow-up appointments you made. If you do not go to an appointment, you could develop lasting (chronic) problems with pain. If there is any problem keeping an appointment, you must call to reschedule.  SEEK MEDICAL CARE IF:   You think you are having problems from the medicine you are taking. Read your medicine instructions carefully.   Your chest pain does not go away, even after treatment.   You develop a rash with blisters on your chest.  SEEK IMMEDIATE MEDICAL CARE IF:   You have increased chest pain or pain that spreads to your arm, neck,  jaw, back, or belly (abdomen).   You develop shortness of breath, an increasing cough, or you are coughing up blood.   You have severe back or abdominal pain, feel sick to your stomach (nauseous) or throw up (vomit).   You develop severe weakness, fainting, or chills.   You have an oral temperature above 102 F (38.9 C), not controlled by medicine.  THIS IS AN EMERGENCY. Do not wait to see if the pain will go away. Get medical help at once. Call your local emergency services (911 in [place] Do not drive yourself to the hospital. MAKE SURE YOU:   Understand these instructions.   Will watch your condition.   Will get help right away if you are not doing well or get worse.  Document Released: 12/22/2004 Document Revised: 11/24/2010 Document Reviewed: 10/18/2007 [facility] Patient Information 2012 [place], [place].

## 2011-05-25 NOTE — ED Provider Notes (Signed)
History     CSN: [id number]  Arrival date & time 05/25/11  7829   First MD Initiated Contact with Patient 05/25/11 [phone]      Chief Complaint  Patient presents with  . Chest Pain    (Consider location/radiation/quality/duration/timing/severity/associated sxs/prior treatment) HPI Comments: Patient presents with complaints of left lateral chest pain that radiates to her left back.  She notes that it is worse with certain changes in position even while sitting in the bed.  She has no significant shortness of breath.  It does not specifically change with exertion.  Patient does not note that it is pleuritic in nature.  She did just recently have a left knee surgery earlier this month and is ambulating at home.  There is no drainage or fevers at home.  Patient comes in because the pain has been persistent since yesterday and she wanted to have it checked out.  She has not had any significant cough and does not smoke.  Patient is a 78 y.o. female presenting with chest pain. The history is provided by the patient. No language interpreter was used.  Chest Pain The chest pain began yesterday. Chest pain occurs intermittently. The chest pain is unchanged. The severity of the pain is moderate. The quality of the pain is described as aching. Chest pain is worsened by certain positions. Pertinent negatives for primary symptoms include no fever, no fatigue, no syncope, no shortness of breath, no cough, no wheezing, no palpitations, no abdominal pain, no nausea, no vomiting, no dizziness and no altered mental status.  Pertinent negatives for associated symptoms include no claudication, no diaphoresis, no lower extremity edema, no near-syncope, no numbness, no orthopnea, no paroxysmal nocturnal dyspnea and no weakness.     Past Medical History  Diagnosis Date  . Diabetes mellitus   . Obesity   . Hypertension   . Gout   . Dyslipidemia   . Arthritis     RHEUMATOID  . Glaucoma   . Colonic polyp   .  Anemia   . Renal insufficiency     Past Surgical History  Procedure Date  . Thyroidectomy   . Nephrectomy     right - 1980  . Eye surgery     bilateral cataracts removed   . Total knee arthroplasty 04/26/2011    Procedure: TOTAL KNEE ARTHROPLASTY;  Surgeon: Shelda Pal, MD;  Location: [facility];  Service: Orthopedics;  Laterality: Left;    History reviewed. No pertinent family history.  History  Substance Use Topics  . Smoking status: Former Smoker    Quit date: 03/28/1981  . Smokeless tobacco: Never Used  . Alcohol Use: No    OB History    Grav Para Term Preterm Abortions TAB SAB Ect Mult Living                  Review of Systems  Constitutional: Negative.  Negative for fever, chills, diaphoresis and fatigue.  HENT: Negative.   Eyes: Negative.  Negative for discharge and redness.  Respiratory: Negative.  Negative for cough, shortness of breath and wheezing.   Cardiovascular: Positive for chest pain. Negative for palpitations, orthopnea, claudication, syncope and near-syncope.  Gastrointestinal: Negative.  Negative for nausea, vomiting, abdominal pain and diarrhea.  Genitourinary: Negative.  Negative for dysuria and vaginal discharge.  Musculoskeletal: Positive for back pain.  Skin: Negative.  Negative for color change and rash.  Neurological: Negative.  Negative for dizziness, syncope, weakness, numbness and headaches.  Hematological: Negative.  Negative for  adenopathy.  Psychiatric/Behavioral: Negative.  Negative for confusion and altered mental status.  All other systems reviewed and are negative.    Allergies  Phenobarbital  Home Medications   Current Outpatient Rx  Name Route Sig Dispense Refill  . ALLOPURINOL 300 MG PO TABS Oral Take 300 mg by mouth daily.     Marland Kitchen AMLODIPINE BESYLATE 10 MG PO TABS Oral Take 10 mg by mouth daily with breakfast.     . ASPIRIN 81 MG PO CHEW Oral Chew 81 mg by mouth daily.    . ATORVASTATIN CALCIUM 40 MG PO TABS Oral Take 40  mg by mouth daily with breakfast.     . VITAMIN D3 1000 UNITS PO CAPS Oral Take 1,000 mg by mouth daily.     Marland Kitchen FERROUS SULFATE 325 (65 FE) MG PO TABS Oral Take 1 tablet (325 mg total) by mouth 3 (three) times daily with meals.    Marland Kitchen FOLIC ACID 1 MG PO TABS Oral Take 1 mg by mouth daily.     Marland Kitchen HYDROCODONE-ACETAMINOPHEN 7.5-325 MG PO TABS Oral Take 1 tablet by mouth every 4 (four) hours as needed. pain    . LISINOPRIL-HYDROCHLOROTHIAZIDE 20-12.5 MG PO TABS Oral Take 1 tablet by mouth daily with breakfast.    . LUMIGAN 0.01 % OP SOLN Both Eyes Place 1 drop into both eyes at bedtime.     Marland Kitchen METFORMIN HCL ER (MOD) 500 MG PO TB24 Oral Take 500 mg by mouth 2 (two) times daily with a meal.    . METHOTREXATE (ANTI-RHEUMATIC) 2.5 MG PO TABS Oral Take 10 mg by mouth once a week. Take on Tuesdays    . METOPROLOL SUCCINATE ER 50 MG PO TB24 Oral Take 1 tablet (50 mg total) by mouth daily. Take with or immediately following a meal. 30 tablet 11  . ADULT MULTIVITAMIN W/MINERALS CH Oral Take 1 tablet by mouth daily.     Marland Kitchen PREDNISONE 5 MG PO TABS Oral Take 5 mg by mouth daily.       BP 120/65  Pulse 100  Temp(Src) 98.8 F (37.1 C) (Oral)  Resp 24  SpO2 100%  Physical Exam  Nursing note and vitals reviewed. Constitutional: She is oriented to person, place, and time. She appears well-developed and well-nourished.  Non-toxic appearance. She does not have a sickly appearance.  HENT:  Head: Normocephalic and atraumatic.  Eyes: Conjunctivae, EOM and lids are normal. Pupils are equal, round, and reactive to light. No scleral icterus.  Neck: Trachea normal and normal range of motion. Neck supple.  Cardiovascular: Normal rate, regular rhythm and normal heart sounds.   Pulmonary/Chest: Effort normal and breath sounds normal. No respiratory distress. She has no wheezes. She has no rales.       Mild tenderness to palpation of left lateral chest wall  Abdominal: Soft. Normal appearance. There is no tenderness. There  is no rebound, no guarding and no CVA tenderness.  Musculoskeletal: Normal range of motion.  Neurological: She is alert and oriented to person, place, and time. She has normal strength.  Skin: Skin is warm, dry and intact. No rash noted.  Psychiatric: She has a normal mood and affect. Her behavior is normal. Judgment and thought content normal.    ED Course  Procedures (including critical care time)  Labs Reviewed  CBC - Abnormal; Notable for the following:    WBC 14.3 (*)    RBC 3.01 (*)    Hemoglobin 9.2 (*)    HCT 28.9 (*)  RDW 18.0 (*)    All other components within normal limits  DIFFERENTIAL - Abnormal; Notable for the following:    Neutrophils Relative 82 (*)    Neutro Abs 11.7 (*)    Lymphocytes Relative 10 (*)    Monocytes Absolute 1.1 (*)    All other components within normal limits  BASIC METABOLIC PANEL - Abnormal; Notable for the following:    Glucose, Bld 133 (*)    Creatinine, Ser 1.13 (*)    Calcium 12.9 (*)    GFR calc non Af Amer 44 (*)    GFR calc Af Amer 51 (*)    All other components within normal limits  CARDIAC PANEL(CRET KIN+CKTOT+MB+TROPI)  URINALYSIS, ROUTINE W REFLEX MICROSCOPIC   Dg Chest 2 View  05/25/2011  *RADIOLOGY REPORT*  Clinical Data: Postop.  Chest pain.  CHEST - 2 VIEW  Comparison: 05/26/2010.  Findings: Trachea is midline. Trachea is midline.  Heart size is difficult to assess, given low lung volumes.  Mild bibasilar atelectasis.  No definite pleural fluid.  Degenerative changes are seen in the shoulders and spine.  IMPRESSION: Low lung volumes with bibasilar atelectasis.  Original Report Authenticated By: Reyes Ivan, M.D.      Date: 05/25/2011  Rate: 98  Rhythm: normal sinus rhythm  QRS Axis: normal  Intervals: normal  ST/T Wave abnormalities: normal  Conduction Disutrbances:none  Narrative Interpretation:   Old EKG Reviewed: unchanged from 04/20/11    MDM  Patient with no signs of pneumonia on her chest x-ray.  No  acute signs of ischemia in her pain would be atypical for ACS.  The pain is also not related to exertion which would make it atypical for ACS.  Her urinalysis shows no signs of urinary tract infection to indicate that this is a pyelonephritis.  Patient has a mild leukocytosis that is of unclear etiology as the patient is afebrile at this time.  Patient has been mobile and has no pleuritic component to her pain and no hypoxia to suggest that this is PE at this time.  I have encouraged the patient to followup with her primary care physician as scheduled on March 5.  She also knows to return if she has worsening symptoms of pain, shortness of breath, fevers or other concerns.  At this point I will discharge the patient home.        Nat Christen, MD 05/25/11 [phone]

## 2011-05-25 NOTE — ED Notes (Signed)
Patient remains on monitor and oxygen with sats 100% on RA. Family at bedside.

## 2011-05-25 NOTE — ED Notes (Signed)
Patient resting and remains on monitor with NAD at this time. Family at bedside.  

## 2011-05-25 NOTE — ED Notes (Signed)
Per EMS: pt recently had left knee surgery.  Wakes up in the middle of the night to stretch knee and c/o left rib pain that she described as a cramping sensation.  Pt states she is still uncomfortable at this time.  Also has arthritis in left shoulder.  Denies n/v, pain is better when sitting up, denies SOB, dizziness.

## 2011-05-25 NOTE — ED Notes (Signed)
Patient remains on monitor and is resting with NAD at this time. Family at bedside.  

## 2011-05-25 NOTE — ED Notes (Signed)
First meeting with patient. Patient states she woke up during the night and was stretching her knee and started to have pain in her left side rib cage area. Patient states she had left knee replacement recently. Patient resting and remains on monitor with NAD at this time. Family at bedside.

## 2011-05-26 DIAGNOSIS — E119 Type 2 diabetes mellitus without complications: Secondary | ICD-10-CM | POA: Diagnosis not present

## 2011-05-26 DIAGNOSIS — M109 Gout, unspecified: Secondary | ICD-10-CM | POA: Diagnosis not present

## 2011-05-26 DIAGNOSIS — R269 Unspecified abnormalities of gait and mobility: Secondary | ICD-10-CM | POA: Diagnosis not present

## 2011-05-26 DIAGNOSIS — Z471 Aftercare following joint replacement surgery: Secondary | ICD-10-CM | POA: Diagnosis not present

## 2011-05-26 DIAGNOSIS — I10 Essential (primary) hypertension: Secondary | ICD-10-CM | POA: Diagnosis not present

## 2011-05-26 DIAGNOSIS — M069 Rheumatoid arthritis, unspecified: Secondary | ICD-10-CM | POA: Diagnosis not present

## 2011-05-27 DIAGNOSIS — M109 Gout, unspecified: Secondary | ICD-10-CM | POA: Diagnosis not present

## 2011-05-27 DIAGNOSIS — E119 Type 2 diabetes mellitus without complications: Secondary | ICD-10-CM | POA: Diagnosis not present

## 2011-05-27 DIAGNOSIS — M069 Rheumatoid arthritis, unspecified: Secondary | ICD-10-CM | POA: Diagnosis not present

## 2011-05-27 DIAGNOSIS — R269 Unspecified abnormalities of gait and mobility: Secondary | ICD-10-CM | POA: Diagnosis not present

## 2011-05-27 DIAGNOSIS — I10 Essential (primary) hypertension: Secondary | ICD-10-CM | POA: Diagnosis not present

## 2011-05-27 DIAGNOSIS — Z471 Aftercare following joint replacement surgery: Secondary | ICD-10-CM | POA: Diagnosis not present

## 2011-05-28 DIAGNOSIS — Z471 Aftercare following joint replacement surgery: Secondary | ICD-10-CM | POA: Diagnosis not present

## 2011-05-28 DIAGNOSIS — I10 Essential (primary) hypertension: Secondary | ICD-10-CM | POA: Diagnosis not present

## 2011-05-28 DIAGNOSIS — E119 Type 2 diabetes mellitus without complications: Secondary | ICD-10-CM | POA: Diagnosis not present

## 2011-05-28 DIAGNOSIS — M069 Rheumatoid arthritis, unspecified: Secondary | ICD-10-CM | POA: Diagnosis not present

## 2011-05-28 DIAGNOSIS — R269 Unspecified abnormalities of gait and mobility: Secondary | ICD-10-CM | POA: Diagnosis not present

## 2011-05-28 DIAGNOSIS — M109 Gout, unspecified: Secondary | ICD-10-CM | POA: Diagnosis not present

## 2011-05-30 DIAGNOSIS — I10 Essential (primary) hypertension: Secondary | ICD-10-CM | POA: Diagnosis not present

## 2011-05-30 DIAGNOSIS — R269 Unspecified abnormalities of gait and mobility: Secondary | ICD-10-CM | POA: Diagnosis not present

## 2011-05-30 DIAGNOSIS — Z471 Aftercare following joint replacement surgery: Secondary | ICD-10-CM | POA: Diagnosis not present

## 2011-05-30 DIAGNOSIS — E119 Type 2 diabetes mellitus without complications: Secondary | ICD-10-CM | POA: Diagnosis not present

## 2011-05-30 DIAGNOSIS — M109 Gout, unspecified: Secondary | ICD-10-CM | POA: Diagnosis not present

## 2011-05-30 DIAGNOSIS — M069 Rheumatoid arthritis, unspecified: Secondary | ICD-10-CM | POA: Diagnosis not present

## 2011-05-31 ENCOUNTER — Encounter: Payer: Self-pay | Admitting: Family Medicine

## 2011-05-31 ENCOUNTER — Ambulatory Visit (INDEPENDENT_AMBULATORY_CARE_PROVIDER_SITE_OTHER): Payer: Medicare Other | Admitting: Family Medicine

## 2011-05-31 VITALS — BP 110/64 | HR 97

## 2011-05-31 DIAGNOSIS — I10 Essential (primary) hypertension: Secondary | ICD-10-CM

## 2011-05-31 DIAGNOSIS — Z87898 Personal history of other specified conditions: Secondary | ICD-10-CM

## 2011-05-31 DIAGNOSIS — E1169 Type 2 diabetes mellitus with other specified complication: Secondary | ICD-10-CM | POA: Diagnosis not present

## 2011-05-31 DIAGNOSIS — Z96659 Presence of unspecified artificial knee joint: Secondary | ICD-10-CM

## 2011-05-31 NOTE — Progress Notes (Signed)
  Subjective:    Patient ID: Joanne Valdez, female    DOB: 10-17-1926, 78 y.o.   MRN: [id number]  HPI She is here for followup visit after recent trip to the emergency room for evaluation of chest pain. The emergency room record was reviewed. She was sent home. Since then she has had no chest pain, shortness of breath, DOE. She has no other complaints. Her son who is a CNA is taking good care of her.  Review of Systems     Objective:   Physical Exam alert and in no distress. Tympanic membranes and canals are normal. Throat is clear. Tonsils are normal. Neck is supple without adenopathy or thyromegaly. Cardiac exam shows a regular sinus rhythm without murmurs or gallops. Lungs are clear to auscultation.        Assessment & Plan:   1. Hypertension associated with diabetes   2. S/P left knee replacement   3. History of chest pain    continue present medication regimen. I signed a handicap placard sticker for her.

## 2011-05-31 NOTE — Patient Instructions (Signed)
Stay on your present medications 

## 2011-06-01 DIAGNOSIS — M069 Rheumatoid arthritis, unspecified: Secondary | ICD-10-CM | POA: Diagnosis not present

## 2011-06-01 DIAGNOSIS — M109 Gout, unspecified: Secondary | ICD-10-CM | POA: Diagnosis not present

## 2011-06-01 DIAGNOSIS — Z471 Aftercare following joint replacement surgery: Secondary | ICD-10-CM | POA: Diagnosis not present

## 2011-06-01 DIAGNOSIS — I10 Essential (primary) hypertension: Secondary | ICD-10-CM | POA: Diagnosis not present

## 2011-06-01 DIAGNOSIS — R269 Unspecified abnormalities of gait and mobility: Secondary | ICD-10-CM | POA: Diagnosis not present

## 2011-06-01 DIAGNOSIS — E119 Type 2 diabetes mellitus without complications: Secondary | ICD-10-CM | POA: Diagnosis not present

## 2011-06-03 DIAGNOSIS — Z471 Aftercare following joint replacement surgery: Secondary | ICD-10-CM | POA: Diagnosis not present

## 2011-06-03 DIAGNOSIS — I10 Essential (primary) hypertension: Secondary | ICD-10-CM | POA: Diagnosis not present

## 2011-06-03 DIAGNOSIS — R269 Unspecified abnormalities of gait and mobility: Secondary | ICD-10-CM | POA: Diagnosis not present

## 2011-06-03 DIAGNOSIS — M069 Rheumatoid arthritis, unspecified: Secondary | ICD-10-CM | POA: Diagnosis not present

## 2011-06-03 DIAGNOSIS — E119 Type 2 diabetes mellitus without complications: Secondary | ICD-10-CM | POA: Diagnosis not present

## 2011-06-03 DIAGNOSIS — M109 Gout, unspecified: Secondary | ICD-10-CM | POA: Diagnosis not present

## 2011-06-03 DIAGNOSIS — M1710 Unilateral primary osteoarthritis, unspecified knee: Secondary | ICD-10-CM | POA: Diagnosis not present

## 2011-06-06 DIAGNOSIS — M069 Rheumatoid arthritis, unspecified: Secondary | ICD-10-CM | POA: Diagnosis not present

## 2011-06-06 DIAGNOSIS — M109 Gout, unspecified: Secondary | ICD-10-CM | POA: Diagnosis not present

## 2011-06-06 DIAGNOSIS — E119 Type 2 diabetes mellitus without complications: Secondary | ICD-10-CM | POA: Diagnosis not present

## 2011-06-06 DIAGNOSIS — Z471 Aftercare following joint replacement surgery: Secondary | ICD-10-CM | POA: Diagnosis not present

## 2011-06-06 DIAGNOSIS — I10 Essential (primary) hypertension: Secondary | ICD-10-CM | POA: Diagnosis not present

## 2011-06-06 DIAGNOSIS — R269 Unspecified abnormalities of gait and mobility: Secondary | ICD-10-CM | POA: Diagnosis not present

## 2011-06-07 ENCOUNTER — Encounter: Payer: Self-pay | Admitting: Cardiovascular Disease

## 2011-06-07 DIAGNOSIS — E119 Type 2 diabetes mellitus without complications: Secondary | ICD-10-CM | POA: Diagnosis not present

## 2011-06-07 DIAGNOSIS — R269 Unspecified abnormalities of gait and mobility: Secondary | ICD-10-CM | POA: Diagnosis not present

## 2011-06-07 DIAGNOSIS — Z471 Aftercare following joint replacement surgery: Secondary | ICD-10-CM | POA: Diagnosis not present

## 2011-06-07 DIAGNOSIS — M069 Rheumatoid arthritis, unspecified: Secondary | ICD-10-CM | POA: Diagnosis not present

## 2011-06-07 DIAGNOSIS — I10 Essential (primary) hypertension: Secondary | ICD-10-CM | POA: Diagnosis not present

## 2011-06-07 DIAGNOSIS — M109 Gout, unspecified: Secondary | ICD-10-CM | POA: Diagnosis not present

## 2011-06-08 DIAGNOSIS — Z471 Aftercare following joint replacement surgery: Secondary | ICD-10-CM | POA: Diagnosis not present

## 2011-06-08 DIAGNOSIS — I10 Essential (primary) hypertension: Secondary | ICD-10-CM | POA: Diagnosis not present

## 2011-06-08 DIAGNOSIS — R269 Unspecified abnormalities of gait and mobility: Secondary | ICD-10-CM | POA: Diagnosis not present

## 2011-06-08 DIAGNOSIS — M109 Gout, unspecified: Secondary | ICD-10-CM | POA: Diagnosis not present

## 2011-06-08 DIAGNOSIS — M069 Rheumatoid arthritis, unspecified: Secondary | ICD-10-CM | POA: Diagnosis not present

## 2011-06-08 DIAGNOSIS — E119 Type 2 diabetes mellitus without complications: Secondary | ICD-10-CM | POA: Diagnosis not present

## 2011-06-09 DIAGNOSIS — M069 Rheumatoid arthritis, unspecified: Secondary | ICD-10-CM | POA: Diagnosis not present

## 2011-06-09 DIAGNOSIS — Z471 Aftercare following joint replacement surgery: Secondary | ICD-10-CM | POA: Diagnosis not present

## 2011-06-09 DIAGNOSIS — M109 Gout, unspecified: Secondary | ICD-10-CM | POA: Diagnosis not present

## 2011-06-09 DIAGNOSIS — R269 Unspecified abnormalities of gait and mobility: Secondary | ICD-10-CM | POA: Diagnosis not present

## 2011-06-09 DIAGNOSIS — E119 Type 2 diabetes mellitus without complications: Secondary | ICD-10-CM | POA: Diagnosis not present

## 2011-06-09 DIAGNOSIS — I10 Essential (primary) hypertension: Secondary | ICD-10-CM | POA: Diagnosis not present

## 2011-06-10 DIAGNOSIS — E119 Type 2 diabetes mellitus without complications: Secondary | ICD-10-CM | POA: Diagnosis not present

## 2011-06-10 DIAGNOSIS — M109 Gout, unspecified: Secondary | ICD-10-CM | POA: Diagnosis not present

## 2011-06-10 DIAGNOSIS — I10 Essential (primary) hypertension: Secondary | ICD-10-CM | POA: Diagnosis not present

## 2011-06-10 DIAGNOSIS — M069 Rheumatoid arthritis, unspecified: Secondary | ICD-10-CM | POA: Diagnosis not present

## 2011-06-10 DIAGNOSIS — Z471 Aftercare following joint replacement surgery: Secondary | ICD-10-CM | POA: Diagnosis not present

## 2011-06-10 DIAGNOSIS — R269 Unspecified abnormalities of gait and mobility: Secondary | ICD-10-CM | POA: Diagnosis not present

## 2011-06-13 DIAGNOSIS — E119 Type 2 diabetes mellitus without complications: Secondary | ICD-10-CM | POA: Diagnosis not present

## 2011-06-13 DIAGNOSIS — R269 Unspecified abnormalities of gait and mobility: Secondary | ICD-10-CM | POA: Diagnosis not present

## 2011-06-13 DIAGNOSIS — M109 Gout, unspecified: Secondary | ICD-10-CM | POA: Diagnosis not present

## 2011-06-13 DIAGNOSIS — I10 Essential (primary) hypertension: Secondary | ICD-10-CM | POA: Diagnosis not present

## 2011-06-13 DIAGNOSIS — Z471 Aftercare following joint replacement surgery: Secondary | ICD-10-CM | POA: Diagnosis not present

## 2011-06-13 DIAGNOSIS — M069 Rheumatoid arthritis, unspecified: Secondary | ICD-10-CM | POA: Diagnosis not present

## 2011-06-14 DIAGNOSIS — M109 Gout, unspecified: Secondary | ICD-10-CM | POA: Diagnosis not present

## 2011-06-14 DIAGNOSIS — Z471 Aftercare following joint replacement surgery: Secondary | ICD-10-CM | POA: Diagnosis not present

## 2011-06-14 DIAGNOSIS — M069 Rheumatoid arthritis, unspecified: Secondary | ICD-10-CM | POA: Diagnosis not present

## 2011-06-14 DIAGNOSIS — E119 Type 2 diabetes mellitus without complications: Secondary | ICD-10-CM | POA: Diagnosis not present

## 2011-06-14 DIAGNOSIS — R269 Unspecified abnormalities of gait and mobility: Secondary | ICD-10-CM | POA: Diagnosis not present

## 2011-06-14 DIAGNOSIS — I10 Essential (primary) hypertension: Secondary | ICD-10-CM | POA: Diagnosis not present

## 2011-06-16 DIAGNOSIS — M109 Gout, unspecified: Secondary | ICD-10-CM | POA: Diagnosis not present

## 2011-06-16 DIAGNOSIS — I10 Essential (primary) hypertension: Secondary | ICD-10-CM | POA: Diagnosis not present

## 2011-06-16 DIAGNOSIS — E119 Type 2 diabetes mellitus without complications: Secondary | ICD-10-CM | POA: Diagnosis not present

## 2011-06-16 DIAGNOSIS — R269 Unspecified abnormalities of gait and mobility: Secondary | ICD-10-CM | POA: Diagnosis not present

## 2011-06-16 DIAGNOSIS — Z471 Aftercare following joint replacement surgery: Secondary | ICD-10-CM | POA: Diagnosis not present

## 2011-06-16 DIAGNOSIS — M069 Rheumatoid arthritis, unspecified: Secondary | ICD-10-CM | POA: Diagnosis not present

## 2011-06-17 DIAGNOSIS — M109 Gout, unspecified: Secondary | ICD-10-CM | POA: Diagnosis not present

## 2011-06-17 DIAGNOSIS — Z471 Aftercare following joint replacement surgery: Secondary | ICD-10-CM | POA: Diagnosis not present

## 2011-06-17 DIAGNOSIS — R269 Unspecified abnormalities of gait and mobility: Secondary | ICD-10-CM | POA: Diagnosis not present

## 2011-06-17 DIAGNOSIS — E119 Type 2 diabetes mellitus without complications: Secondary | ICD-10-CM | POA: Diagnosis not present

## 2011-06-17 DIAGNOSIS — M069 Rheumatoid arthritis, unspecified: Secondary | ICD-10-CM | POA: Diagnosis not present

## 2011-06-17 DIAGNOSIS — I10 Essential (primary) hypertension: Secondary | ICD-10-CM | POA: Diagnosis not present

## 2011-06-20 DIAGNOSIS — E119 Type 2 diabetes mellitus without complications: Secondary | ICD-10-CM | POA: Diagnosis not present

## 2011-06-20 DIAGNOSIS — R269 Unspecified abnormalities of gait and mobility: Secondary | ICD-10-CM | POA: Diagnosis not present

## 2011-06-20 DIAGNOSIS — I10 Essential (primary) hypertension: Secondary | ICD-10-CM | POA: Diagnosis not present

## 2011-06-20 DIAGNOSIS — Z471 Aftercare following joint replacement surgery: Secondary | ICD-10-CM | POA: Diagnosis not present

## 2011-06-20 DIAGNOSIS — M109 Gout, unspecified: Secondary | ICD-10-CM | POA: Diagnosis not present

## 2011-06-20 DIAGNOSIS — M069 Rheumatoid arthritis, unspecified: Secondary | ICD-10-CM | POA: Diagnosis not present

## 2011-06-21 DIAGNOSIS — M109 Gout, unspecified: Secondary | ICD-10-CM | POA: Diagnosis not present

## 2011-06-21 DIAGNOSIS — E119 Type 2 diabetes mellitus without complications: Secondary | ICD-10-CM | POA: Diagnosis not present

## 2011-06-21 DIAGNOSIS — I10 Essential (primary) hypertension: Secondary | ICD-10-CM | POA: Diagnosis not present

## 2011-06-21 DIAGNOSIS — M069 Rheumatoid arthritis, unspecified: Secondary | ICD-10-CM | POA: Diagnosis not present

## 2011-06-21 DIAGNOSIS — Z471 Aftercare following joint replacement surgery: Secondary | ICD-10-CM | POA: Diagnosis not present

## 2011-06-21 DIAGNOSIS — R269 Unspecified abnormalities of gait and mobility: Secondary | ICD-10-CM | POA: Diagnosis not present

## 2011-06-23 DIAGNOSIS — E119 Type 2 diabetes mellitus without complications: Secondary | ICD-10-CM | POA: Diagnosis not present

## 2011-06-23 DIAGNOSIS — I10 Essential (primary) hypertension: Secondary | ICD-10-CM | POA: Diagnosis not present

## 2011-06-23 DIAGNOSIS — Z471 Aftercare following joint replacement surgery: Secondary | ICD-10-CM | POA: Diagnosis not present

## 2011-06-23 DIAGNOSIS — M109 Gout, unspecified: Secondary | ICD-10-CM | POA: Diagnosis not present

## 2011-06-23 DIAGNOSIS — R269 Unspecified abnormalities of gait and mobility: Secondary | ICD-10-CM | POA: Diagnosis not present

## 2011-06-23 DIAGNOSIS — M069 Rheumatoid arthritis, unspecified: Secondary | ICD-10-CM | POA: Diagnosis not present

## 2011-06-28 DIAGNOSIS — M109 Gout, unspecified: Secondary | ICD-10-CM | POA: Diagnosis not present

## 2011-06-28 DIAGNOSIS — M069 Rheumatoid arthritis, unspecified: Secondary | ICD-10-CM | POA: Diagnosis not present

## 2011-06-28 DIAGNOSIS — E119 Type 2 diabetes mellitus without complications: Secondary | ICD-10-CM | POA: Diagnosis not present

## 2011-06-28 DIAGNOSIS — R269 Unspecified abnormalities of gait and mobility: Secondary | ICD-10-CM | POA: Diagnosis not present

## 2011-06-28 DIAGNOSIS — Z471 Aftercare following joint replacement surgery: Secondary | ICD-10-CM | POA: Diagnosis not present

## 2011-06-28 DIAGNOSIS — I10 Essential (primary) hypertension: Secondary | ICD-10-CM | POA: Diagnosis not present

## 2011-07-01 DIAGNOSIS — E119 Type 2 diabetes mellitus without complications: Secondary | ICD-10-CM | POA: Diagnosis not present

## 2011-07-01 DIAGNOSIS — Z471 Aftercare following joint replacement surgery: Secondary | ICD-10-CM | POA: Diagnosis not present

## 2011-07-01 DIAGNOSIS — M109 Gout, unspecified: Secondary | ICD-10-CM | POA: Diagnosis not present

## 2011-07-01 DIAGNOSIS — M069 Rheumatoid arthritis, unspecified: Secondary | ICD-10-CM | POA: Diagnosis not present

## 2011-07-01 DIAGNOSIS — I10 Essential (primary) hypertension: Secondary | ICD-10-CM | POA: Diagnosis not present

## 2011-07-01 DIAGNOSIS — R269 Unspecified abnormalities of gait and mobility: Secondary | ICD-10-CM | POA: Diagnosis not present

## 2011-07-12 ENCOUNTER — Encounter: Payer: Self-pay | Admitting: Cardiovascular Disease

## 2011-07-12 ENCOUNTER — Ambulatory Visit (INDEPENDENT_AMBULATORY_CARE_PROVIDER_SITE_OTHER): Payer: Medicare Other | Admitting: Cardiovascular Disease

## 2011-07-12 DIAGNOSIS — M069 Rheumatoid arthritis, unspecified: Secondary | ICD-10-CM | POA: Diagnosis not present

## 2011-07-12 DIAGNOSIS — E119 Type 2 diabetes mellitus without complications: Secondary | ICD-10-CM

## 2011-07-12 DIAGNOSIS — E785 Hyperlipidemia, unspecified: Secondary | ICD-10-CM | POA: Diagnosis not present

## 2011-07-12 DIAGNOSIS — R000 Tachycardia, unspecified: Secondary | ICD-10-CM

## 2011-07-12 NOTE — Assessment & Plan Note (Signed)
TKR and arthritis limits her.  Continue low dose prednisone and methotrexate.  Rehab  F/U Susann Givens

## 2011-07-12 NOTE — Assessment & Plan Note (Signed)
Discussed low carb diet.  Target hemoglobin A1c is 6.5 or less.  Continue current medications.  

## 2011-07-12 NOTE — Assessment & Plan Note (Signed)
Cholesterol is at goal.  Continue current dose of statin and diet Rx.  No myalgias or side effects.  F/U  LFT's in 6 months. Lab Results  Component Value Date   LDLCALC 82 12/01/2010             

## 2011-07-12 NOTE — Progress Notes (Signed)
78 yo S/P surgery with  Dr Charlann Boxer scheduled for 1/29. Seen by primary Susann Givens. Has always had high HR. Anemic with Hct about 30. Negative w/u including colonoscopy. Previous right kidney removal in 80;s. No previous cardiac history. Activity limited by knee pain not dyspnea or SSCP Mild LE edema with varicosities LLE worse than right. Had 3/4 of her thyroid removed a long time ago. No recent TSH/T4 in Epic. No previous anesthetic complications or bleeding problems. No chronic lung disease, CAD, or arrythmia other than tachycardia Preop echo 1/13 was normal.  Had vague SSCP a few weeks ago with negative ER evaluation and no recurrence.  Tolerating beta blocker.  No other new problems  ROS: Denies fever, malais, weight loss, blurry vision, decreased visual acuity, cough, sputum, SOB, hemoptysis, pleuritic pain, palpitaitons, heartburn, abdominal pain, melena, lower extremity edema, claudication, or rash.  All other systems reviewed and negative  General: Affect appropriate Healthy:  appears stated age HEENT: normal Neck supple with no adenopathy JVP normal no bruits no thyromegaly Lungs clear with no wheezing and good diaphragmatic motion Heart:  S1/S2 no murmur, no rub, gallop or click PMI normal Abdomen: benighn, BS positve, no tenderness, no AAA no bruit.  No HSM or HJR Distal pulses intact with no bruits No edema Neuro non-focal Skin warm and dry No muscular weakness   Current Outpatient Prescriptions  Medication Sig Dispense Refill  . allopurinol (ZYLOPRIM) 300 MG tablet Take 300 mg by mouth daily.       Marland Kitchen amLODipine (NORVASC) 10 MG tablet Take 10 mg by mouth daily with breakfast.       . aspirin 81 MG chewable tablet Chew 81 mg by mouth daily.      Marland Kitchen atorvastatin (LIPITOR) 40 MG tablet Take 40 mg by mouth daily with breakfast.       . Cholecalciferol (VITAMIN D3) 1000 UNITS CAPS Take 1,000 mg by mouth daily.       . ferrous sulfate 325 (65 FE) MG tablet Take 1 tablet (325 mg total) by  mouth 3 (three) times daily with meals.      . folic acid (FOLVITE) 1 MG tablet Take 1 mg by mouth daily.       Marland Kitchen HYDROcodone-acetaminophen (NORCO) 7.5-325 MG per tablet Take 1 tablet by mouth every 4 (four) hours as needed. pain      . lisinopril-hydrochlorothiazide (PRINZIDE,ZESTORETIC) 20-12.5 MG per tablet Take 1 tablet by mouth daily with breakfast.      . LUMIGAN 0.01 % SOLN Place 1 drop into both eyes at bedtime.       . metFORMIN (GLUMETZA) 500 MG (MOD) 24 hr tablet Take 500 mg by mouth daily with breakfast.       . methotrexate (RHEUMATREX) 2.5 MG tablet Take 10 mg by mouth once a week. Take on Tuesdays      . metoprolol succinate (TOPROL-XL) 50 MG 24 hr tablet Take 1 tablet (50 mg total) by mouth daily. Take with or immediately following a meal.  30 tablet  11  . Multiple Vitamin (MULITIVITAMIN WITH MINERALS) TABS Take 1 tablet by mouth daily.       . predniSONE (DELTASONE) 5 MG tablet Take 5 mg by mouth daily.         Allergies  Phenobarbital  Electrocardiogram:  Assessment and Plan

## 2011-07-12 NOTE — Patient Instructions (Signed)
Your physician wants you to follow-up in: YEAR WITH DR NISHAN  You will receive a reminder letter in the mail two months in advance. If you don't receive a letter, please call our office to schedule the follow-up appointment.  Your physician recommends that you continue on your current medications as directed. Please refer to the Current Medication list given to you today. 

## 2011-07-12 NOTE — Assessment & Plan Note (Signed)
Improved on metoprolol  Ef normal by echo.  F/U Hct/Hb with primary

## 2011-07-14 DIAGNOSIS — M069 Rheumatoid arthritis, unspecified: Secondary | ICD-10-CM | POA: Diagnosis not present

## 2011-07-18 ENCOUNTER — Telehealth: Payer: Self-pay | Admitting: Family Medicine

## 2011-07-18 MED ORDER — AMLODIPINE BESYLATE 10 MG PO TABS: 10.0000 mg | ORAL_TABLET | Freq: Every day | ORAL | Status: DC

## 2011-07-18 MED ORDER — ALLOPURINOL 300 MG PO TABS: 300.0000 mg | ORAL_TABLET | Freq: Every day | ORAL | Status: DC

## 2011-07-18 NOTE — Telephone Encounter (Signed)
PT HAS A NEW PHARM.  Pt needs a refill on allopurinal 300 mg and amlodipine 10 mg sent to [facility] [address]

## 2011-08-03 DIAGNOSIS — H26499 Other secondary cataract, unspecified eye: Secondary | ICD-10-CM | POA: Diagnosis not present

## 2011-08-03 DIAGNOSIS — H43399 Other vitreous opacities, unspecified eye: Secondary | ICD-10-CM | POA: Diagnosis not present

## 2011-08-03 DIAGNOSIS — H40159 Residual stage of open-angle glaucoma, unspecified eye: Secondary | ICD-10-CM | POA: Diagnosis not present

## 2011-08-03 DIAGNOSIS — H1045 Other chronic allergic conjunctivitis: Secondary | ICD-10-CM | POA: Diagnosis not present

## 2011-08-19 ENCOUNTER — Other Ambulatory Visit: Payer: Self-pay | Admitting: Family Medicine

## 2011-08-29 ENCOUNTER — Telehealth: Payer: Self-pay | Admitting: Internal Medicine

## 2011-08-29 NOTE — Telephone Encounter (Signed)
Also needs a refill on metofrmin ER 500mg , please clarify directions. Pt says 1 Po Daily now

## 2011-08-30 MED ORDER — METFORMIN HCL ER (MOD) 500 MG PO TB24: 500.0000 mg | ORAL_TABLET | Freq: Every day | ORAL | Status: DC

## 2011-08-30 MED ORDER — ATORVASTATIN CALCIUM 40 MG PO TABS: 40.0000 mg | ORAL_TABLET | Freq: Every day | ORAL | Status: DC

## 2011-08-30 NOTE — Telephone Encounter (Signed)
Refilled meds

## 2011-08-31 ENCOUNTER — Other Ambulatory Visit: Payer: Self-pay

## 2011-08-31 MED ORDER — METFORMIN HCL ER 500 MG PO TB24: 500.0000 mg | ORAL_TABLET | Freq: Every day | ORAL | Status: DC

## 2011-09-23 DIAGNOSIS — E119 Type 2 diabetes mellitus without complications: Secondary | ICD-10-CM | POA: Diagnosis not present

## 2011-09-23 DIAGNOSIS — H40159 Residual stage of open-angle glaucoma, unspecified eye: Secondary | ICD-10-CM | POA: Diagnosis not present

## 2011-09-23 DIAGNOSIS — H26499 Other secondary cataract, unspecified eye: Secondary | ICD-10-CM | POA: Diagnosis not present

## 2011-10-23 ENCOUNTER — Inpatient Hospital Stay (HOSPITAL_COMMUNITY)
Admission: EM | Admit: 2011-10-23 | Discharge: 2011-10-25 | DRG: 641 | Disposition: A | Discharge status: Home / Self Care | Payer: Medicare Other | Attending: Family Medicine | Admitting: Family Medicine

## 2011-10-23 ENCOUNTER — Encounter (HOSPITAL_COMMUNITY): Payer: Self-pay | Admitting: Emergency Medicine

## 2011-10-23 ENCOUNTER — Inpatient Hospital Stay (HOSPITAL_COMMUNITY): Payer: Medicare Other

## 2011-10-23 ENCOUNTER — Emergency Department (HOSPITAL_COMMUNITY): Payer: Medicare Other

## 2011-10-23 DIAGNOSIS — R05 Cough: Secondary | ICD-10-CM | POA: Diagnosis not present

## 2011-10-23 DIAGNOSIS — E860 Dehydration: Secondary | ICD-10-CM

## 2011-10-23 DIAGNOSIS — D649 Anemia, unspecified: Secondary | ICD-10-CM

## 2011-10-23 DIAGNOSIS — M069 Rheumatoid arthritis, unspecified: Secondary | ICD-10-CM | POA: Diagnosis present

## 2011-10-23 DIAGNOSIS — E119 Type 2 diabetes mellitus without complications: Secondary | ICD-10-CM

## 2011-10-23 DIAGNOSIS — I152 Hypertension secondary to endocrine disorders: Secondary | ICD-10-CM | POA: Diagnosis present

## 2011-10-23 DIAGNOSIS — I5032 Chronic diastolic (congestive) heart failure: Secondary | ICD-10-CM | POA: Diagnosis not present

## 2011-10-23 DIAGNOSIS — D509 Iron deficiency anemia, unspecified: Secondary | ICD-10-CM | POA: Diagnosis present

## 2011-10-23 DIAGNOSIS — R079 Chest pain, unspecified: Secondary | ICD-10-CM

## 2011-10-23 DIAGNOSIS — E785 Hyperlipidemia, unspecified: Secondary | ICD-10-CM | POA: Diagnosis present

## 2011-10-23 DIAGNOSIS — R000 Tachycardia, unspecified: Secondary | ICD-10-CM | POA: Diagnosis not present

## 2011-10-23 DIAGNOSIS — R55 Syncope and collapse: Secondary | ICD-10-CM

## 2011-10-23 DIAGNOSIS — I509 Heart failure, unspecified: Secondary | ICD-10-CM | POA: Diagnosis present

## 2011-10-23 DIAGNOSIS — E869 Volume depletion, unspecified: Principal | ICD-10-CM | POA: Diagnosis present

## 2011-10-23 DIAGNOSIS — I517 Cardiomegaly: Secondary | ICD-10-CM | POA: Diagnosis not present

## 2011-10-23 DIAGNOSIS — R0989 Other specified symptoms and signs involving the circulatory and respiratory systems: Secondary | ICD-10-CM | POA: Diagnosis not present

## 2011-10-23 DIAGNOSIS — R5381 Other malaise: Secondary | ICD-10-CM | POA: Diagnosis not present

## 2011-10-23 DIAGNOSIS — R748 Abnormal levels of other serum enzymes: Secondary | ICD-10-CM | POA: Diagnosis present

## 2011-10-23 DIAGNOSIS — E1159 Type 2 diabetes mellitus with other circulatory complications: Secondary | ICD-10-CM

## 2011-10-23 DIAGNOSIS — R404 Transient alteration of awareness: Secondary | ICD-10-CM | POA: Diagnosis not present

## 2011-10-23 DIAGNOSIS — D62 Acute posthemorrhagic anemia: Secondary | ICD-10-CM | POA: Diagnosis not present

## 2011-10-23 DIAGNOSIS — Z96659 Presence of unspecified artificial knee joint: Secondary | ICD-10-CM

## 2011-10-23 DIAGNOSIS — R059 Cough, unspecified: Secondary | ICD-10-CM | POA: Diagnosis not present

## 2011-10-23 DIAGNOSIS — G319 Degenerative disease of nervous system, unspecified: Secondary | ICD-10-CM | POA: Diagnosis not present

## 2011-10-23 DIAGNOSIS — N179 Acute kidney failure, unspecified: Secondary | ICD-10-CM | POA: Diagnosis present

## 2011-10-23 LAB — CARDIAC PANEL(CRET KIN+CKTOT+MB+TROPI)
CK, MB: 4.7 ng/mL — ABNORMAL HIGH (ref 0.3–4.0)
Relative Index: INVALID (ref 0.0–2.5)
Total CK: 80 U/L (ref 7–177)

## 2011-10-23 LAB — BASIC METABOLIC PANEL
BUN: 42 mg/dL — ABNORMAL HIGH (ref 6–23)
Calcium: 12.6 mg/dL — ABNORMAL HIGH (ref 8.4–10.5)
Creatinine, Ser: 1.46 mg/dL — ABNORMAL HIGH (ref 0.50–1.10)
GFR calc Af Amer: 37 mL/min — ABNORMAL LOW (ref >90)
GFR calc non Af Amer: 32 mL/min — ABNORMAL LOW (ref >90)
Potassium: 3.9 mEq/L (ref 3.5–5.1)

## 2011-10-23 LAB — CBC
HCT: 35.9 % — ABNORMAL LOW (ref 36.0–46.0)
MCHC: 32.3 g/dL (ref 30.0–36.0)
MCV: 97.3 fL (ref 78.0–100.0)
RDW: 16.5 % — ABNORMAL HIGH (ref 11.5–15.5)

## 2011-10-23 LAB — URINALYSIS, ROUTINE W REFLEX MICROSCOPIC
Bilirubin Urine: NEGATIVE
Ketones, ur: NEGATIVE mg/dL
Nitrite: NEGATIVE
Protein, ur: NEGATIVE mg/dL
Urobilinogen, UA: 0.2 mg/dL (ref 0.0–1.0)

## 2011-10-23 MED ORDER — SODIUM CHLORIDE 0.9 % IV SOLN
Freq: Once | INTRAVENOUS | Status: DC
  Administered 2011-10-23: 10:00:00 via INTRAVENOUS

## 2011-10-23 MED ORDER — FERROUS SULFATE 325 (65 FE) MG PO TABS
325.0000 mg | ORAL_TABLET | Freq: Every day | ORAL | Status: DC
  Administered 2011-10-23 – 2011-10-25 (×3): 325 mg via ORAL
  Filled 2011-10-23 (×3): qty 1

## 2011-10-23 MED ORDER — PREDNISONE 5 MG PO TABS
5.0000 mg | ORAL_TABLET | Freq: Every day | ORAL | Status: DC
  Administered 2011-10-23 – 2011-10-25 (×3): 5 mg via ORAL
  Filled 2011-10-23 (×3): qty 1

## 2011-10-23 MED ORDER — SODIUM CHLORIDE 0.9 % IV SOLN
INTRAVENOUS | Status: DC
  Administered 2011-10-23: 1000 mL via INTRAVENOUS

## 2011-10-23 MED ORDER — ASPIRIN 81 MG PO CHEW
324.0000 mg | CHEWABLE_TABLET | Freq: Once | ORAL | Status: AC
  Administered 2011-10-23: 324 mg via ORAL
  Filled 2011-10-23: qty 4

## 2011-10-23 MED ORDER — METHOTREXATE 2.5 MG PO TABS: 10.0000 mg | ORAL_TABLET | ORAL | Status: DC

## 2011-10-23 MED ORDER — FLUTICASONE PROPIONATE 50 MCG/ACT NA SUSP
2.0000 | Freq: Every day | NASAL | Status: DC
  Administered 2011-10-23 – 2011-10-25 (×3): 2 via NASAL
  Filled 2011-10-23: qty 16

## 2011-10-23 MED ORDER — AMLODIPINE BESYLATE 10 MG PO TABS
10.0000 mg | ORAL_TABLET | Freq: Every day | ORAL | Status: DC
  Filled 2011-10-23: qty 1

## 2011-10-23 MED ORDER — METHOTREXATE (ANTI-RHEUMATIC) 2.5 MG PO TABS: 10.0000 mg | ORAL_TABLET | ORAL | Status: DC

## 2011-10-23 MED ORDER — ADULT MULTIVITAMIN W/MINERALS CH
1.0000 | ORAL_TABLET | Freq: Every day | ORAL | Status: DC
  Administered 2011-10-23 – 2011-10-25 (×3): 1 via ORAL
  Filled 2011-10-23 (×3): qty 1

## 2011-10-23 MED ORDER — FOLIC ACID 1 MG PO TABS
1.0000 mg | ORAL_TABLET | Freq: Every day | ORAL | Status: DC
  Administered 2011-10-23 – 2011-10-25 (×3): 1 mg via ORAL
  Filled 2011-10-23 (×3): qty 1

## 2011-10-23 MED ORDER — ALLOPURINOL 300 MG PO TABS
300.0000 mg | ORAL_TABLET | Freq: Every day | ORAL | Status: DC
  Administered 2011-10-23 – 2011-10-25 (×3): 300 mg via ORAL
  Filled 2011-10-23 (×3): qty 1

## 2011-10-23 MED ORDER — SODIUM CHLORIDE 0.9 % IV BOLUS (SEPSIS)
250.0000 mL | Freq: Once | INTRAVENOUS | Status: AC
  Administered 2011-10-23: 250 mL via INTRAVENOUS

## 2011-10-23 MED ORDER — ATORVASTATIN CALCIUM 40 MG PO TABS
40.0000 mg | ORAL_TABLET | Freq: Every day | ORAL | Status: DC
  Administered 2011-10-23 – 2011-10-24 (×2): 40 mg via ORAL
  Filled 2011-10-23 (×3): qty 1

## 2011-10-23 MED ORDER — ENOXAPARIN SODIUM 30 MG/0.3ML ~~LOC~~ SOLN
30.0000 mg | Freq: Two times a day (BID) | SUBCUTANEOUS | Status: DC
  Administered 2011-10-23 – 2011-10-25 (×4): 30 mg via SUBCUTANEOUS
  Filled 2011-10-23 (×5): qty 0.3

## 2011-10-23 MED ORDER — BIMATOPROST 0.01 % OP SOLN
1.0000 [drp] | Freq: Every day | OPHTHALMIC | Status: DC
  Administered 2011-10-24 (×2): 1 [drp] via OPHTHALMIC
  Filled 2011-10-23 (×2): qty 2.5

## 2011-10-23 NOTE — ED Notes (Signed)
ONG:[id number]<WU> Expected date:<BR> Expected time:<BR> Means of arrival:<BR> Comments:<BR> Medic 212, 87F, Syncopal Episode, Rm 17

## 2011-10-23 NOTE — ED Provider Notes (Signed)
History     CSN: [id number]  Arrival date & time 10/23/11  4696   First MD Initiated Contact with Patient 10/23/11 [phone]      Chief Complaint  Patient presents with  . Loss of Consciousness    (Consider location/radiation/quality/duration/timing/severity/associated sxs/prior treatment) HPI Comments: Joanne Valdez 78 y.o. female   The chief complaint is: Patient presents with:   Loss of Consciousness   The patient has medical history significant for:   Past Medical History:   Diabetes mellitus                                            Obesity                                                      Hypertension                                                 Gout                                                         Dyslipidemia                                                 Arthritis                                                      Comment:RHEUMATOID   Glaucoma                                                     Colonic polyp                                                Anemia                                                       Renal insufficiency                                         The onset of the  symptoms was  abrupt starting several hours ago,  The Course is completely resolved nothing makes symptoms worse, nothing makes symptoms better Has associated diaphoresis Denies CP, SOB, nausea, vomiting, diarrhea, incontinence, back pain, numbness or tingling in her extremities      The history is provided by the patient, a caregiver and medical records. History Limited By: none.     Joanne Valdez is a 78 y.o. female who presents to the emergency room after a syncopal episode at home.  She states she got up to urinate, voided without trouble and walked back to her room before becoming weak and dizzy.  States she became clammy, but denies CP, SOB, nausea, vomiting, headache.  She slid to the Valdez against her chair and was found unresponsive by her  granddaughter.  It is unknown how long she was unresponsive.  She denies hitting her head, neck pain, back pain, hip/pelvic pain.  She has no personal hx of MI or CVA.  She has a hx of diabetes but CBG per EMS was 83.  She has remained responsive since the incident.      Past Medical History  Diagnosis Date  . Diabetes mellitus   . Obesity   . Hypertension   . Gout   . Dyslipidemia   . Arthritis     RHEUMATOID  . Glaucoma   . Colonic polyp   . Anemia   . Renal insufficiency     Past Surgical History  Procedure Date  . Thyroidectomy   . Nephrectomy     right - 1980  . Eye surgery     bilateral cataracts removed   . Total knee arthroplasty 04/26/2011    Procedure: TOTAL KNEE ARTHROPLASTY;  Surgeon: Shelda Pal, MD;  Location: [facility];  Service: Orthopedics;  Laterality: Left;  . Nephrolithiasis     removed     History reviewed. No pertinent family history.  History  Substance Use Topics  . Smoking status: Former Smoker    Quit date: 03/28/1981  . Smokeless tobacco: Never Used  . Alcohol Use: No    OB History    Grav Para Term Preterm Abortions TAB SAB Ect Mult Living                  Review of Systems  Constitutional: Positive for diaphoresis. Negative for fever, appetite change, fatigue and unexpected weight change.  HENT: Negative for congestion, sore throat, rhinorrhea and mouth sores.   Eyes: Negative for visual disturbance.  Respiratory: Positive for cough (x3 days). Negative for chest tightness, shortness of breath and wheezing.   Cardiovascular: Negative for chest pain and leg swelling.  Gastrointestinal: Negative for nausea, vomiting, abdominal pain, diarrhea and constipation.  Genitourinary: Negative for dysuria, urgency, frequency and hematuria.  Musculoskeletal: Positive for arthralgias (chronic arthritic pain in shoulders). Negative for back pain.  Skin: Negative for rash.  Neurological: Positive for dizziness and syncope. Negative for  light-headedness and headaches.  Psychiatric/Behavioral: The patient is not nervous/anxious.     Allergies  Phenobarbital  Home Medications   Current Outpatient Rx  Name Route Sig Dispense Refill  . ACETAMINOPHEN 500 MG PO TABS Oral Take 500-1,000 mg by mouth every 6 (six) hours as needed.    . ALLOPURINOL 300 MG PO TABS Oral Take 1 tablet (300 mg total) by mouth daily. 30 tablet 11  . AMLODIPINE BESYLATE 10 MG PO TABS Oral Take 1 tablet (10 mg total) by mouth daily with breakfast. 30 tablet 11  .  ATORVASTATIN CALCIUM 40 MG PO TABS Oral Take 1 tablet (40 mg total) by mouth daily with breakfast. 30 tablet 1  . VITAMIN D3 1000 UNITS PO CAPS Oral Take 1,000 mg by mouth daily.     Marland Kitchen FERROUS SULFATE 325 (65 FE) MG PO TABS Oral Take 325 mg by mouth daily with breakfast.    . FOLIC ACID 1 MG PO TABS Oral Take 1 mg by mouth daily.     Marland Kitchen LISINOPRIL-HYDROCHLOROTHIAZIDE 20-12.5 MG PO TABS  TAKE ONE TABLET BY MOUTH EVERY DAY 30 tablet 1  . LUMIGAN 0.01 % OP SOLN Both Eyes Place 1 drop into both eyes at bedtime.     Marland Kitchen METFORMIN HCL ER 500 MG PO TB24 Oral Take 1 tablet (500 mg total) by mouth daily with breakfast. 30 tablet 1  . METHOTREXATE (ANTI-RHEUMATIC) 2.5 MG PO TABS Oral Take 10 mg by mouth once a week. Take on Thursday    . METOPROLOL SUCCINATE ER 50 MG PO TB24 Oral Take 1 tablet (50 mg total) by mouth daily. Take with or immediately following a meal. 30 tablet 11  . ADULT MULTIVITAMIN W/MINERALS CH Oral Take 1 tablet by mouth daily.     Marland Kitchen PREDNISONE 5 MG PO TABS Oral Take 5 mg by mouth daily.     Marland Kitchen HYDROCODONE-ACETAMINOPHEN 7.5-325 MG PO TABS Oral Take 1 tablet by mouth every 4 (four) hours as needed. pain      BP 99/57  Pulse 86  Temp 98.3 F (36.8 C) (Oral)  Resp 11  SpO2 100%  Physical Exam  Nursing note and vitals reviewed. Constitutional: She appears well-developed and well-nourished. No distress.  HENT:  Head: Normocephalic and atraumatic.  Mouth/Throat: Oropharynx is clear  and moist. No oropharyngeal exudate.  Eyes: Conjunctivae are normal. No scleral icterus.  Neck: Normal range of motion. Neck supple.  Cardiovascular: Normal rate, regular rhythm, normal heart sounds and intact distal pulses.  Exam reveals no gallop and no friction rub.   No murmur heard. Pulmonary/Chest: Effort normal and breath sounds normal. No respiratory distress. She has no wheezes.  Abdominal: Soft. Bowel sounds are normal. She exhibits no mass. There is no tenderness. There is no rebound and no guarding.  Musculoskeletal: Normal range of motion. She exhibits no edema.  Neurological: She is alert. She has normal strength. No cranial nerve deficit. She exhibits normal muscle tone. Coordination normal. GCS eye subscore is 4. GCS verbal subscore is 5. GCS motor subscore is 6.       Speech is clear and goal oriented Moves extremities without ataxia Strength normal and equal bilaterally No focal neurological deficit, no facial droop, no slurring of speech.  Skin: Skin is warm and dry. No rash noted. She is not diaphoretic.  Psychiatric: She has a normal mood and affect.    ED Course  Procedures (including critical care time)  Results for orders placed during the hospital encounter of 10/23/11  CBC      Component Value Range   WBC 9.5  4.0 - 10.5 K/uL   RBC 3.69 (*) 3.87 - 5.11 MIL/uL   Hemoglobin 11.6 (*) 12.0 - 15.0 g/dL   HCT 16.1 (*) 09.6 - 04.5 %   MCV 97.3  78.0 - 100.0 fL   MCH 31.4  26.0 - 34.0 pg   MCHC 32.3  30.0 - 36.0 g/dL   RDW 40.9 (*) 81.1 - 91.4 %   Platelets 157  150 - 400 K/uL  BASIC METABOLIC PANEL  Component Value Range   Sodium 140  135 - 145 mEq/L   Potassium 3.9  3.5 - 5.1 mEq/L   Chloride 104  96 - 112 mEq/L   CO2 25  19 - 32 mEq/L   Glucose, Bld 105 (*) 70 - 99 mg/dL   BUN 42 (*) 6 - 23 mg/dL   Creatinine, Ser 4.09 (*) 0.50 - 1.10 mg/dL   Calcium 81.1 (*) 8.4 - 10.5 mg/dL   GFR calc non Af Amer 32 (*) >90 mL/min   GFR calc Af Amer 37 (*) >90  mL/min  URINALYSIS, ROUTINE W REFLEX MICROSCOPIC      Component Value Range   Color, Urine YELLOW  YELLOW   APPearance CLEAR  CLEAR   Specific Gravity, Urine 1.016  1.005 - 1.030   pH 6.5  5.0 - 8.0   Glucose, UA NEGATIVE  NEGATIVE mg/dL   Hgb urine dipstick NEGATIVE  NEGATIVE   Bilirubin Urine NEGATIVE  NEGATIVE   Ketones, ur NEGATIVE  NEGATIVE mg/dL   Protein, ur NEGATIVE  NEGATIVE mg/dL   Urobilinogen, UA 0.2  0.0 - 1.0 mg/dL   Nitrite NEGATIVE  NEGATIVE   Leukocytes, UA NEGATIVE  NEGATIVE  TROPONIN I      Component Value Range   Troponin I 0.76 (*) <0.30 ng/mL   Dg Chest 2 View  10/23/2011  *RADIOLOGY REPORT*  Clinical Data: 78 year old female with cough and congestion.  CHEST - 2 VIEW  Comparison: 05/25/2011 chest radiograph  Findings: Mild cardiomegaly is again identified. Right basilar scarring is unchanged. There is no evidence of focal airspace disease, pulmonary edema, suspicious pulmonary nodule/mass, pleural effusion, or pneumothorax. No acute bony abnormalities are identified. Severe degenerative changes of the shoulders and moderate degenerative changes within the thoracic spine again noted.  IMPRESSION: Mild cardiomegaly without evidence of acute cardiopulmonary disease.  Unchanged right basilar scarring.  Original Report Authenticated By: Rosendo Gros, M.D.   ECG:   Date: 10/23/2011  Rate: 01  Rhythm: normal sinus rhythm  QRS Axis: normal  Intervals: normal  ST/T Wave abnormalities: normal  Conduction Disutrbances:none  Narrative Interpretation: normal sinus rhythm  Old EKG Reviewed: unchanged   ASA ordered.  Hypotensive after walking at 75/56. Fluid bolus of 500cc per EMS + 250cc in ED.  Her troponin resulted at 0.76.  I have repeated this and the result is pending.  11:16 AM     1. Syncope   2. Dehydration   3. Chest pain      MDM  Joanne Valdez presents after a syncopal episode.  She has no other associated symptoms beyond diaphoresis.  Her ECG is  unremarkable and she raises no red flags for ACS.  Her UA is neg indicating there is not a UTI causing the ssx.   Her troponin resulted at 0.76.  At this point it appears the patient has an NSTEMI.  Cardiac panel shows troponin trending upward to 1.16 and an elevated Ck at 5.1.  I have discussed the patient with Dr Effie Shy and we will plan on admission for further evaluation and treatment.         Dahlia Client Meldon Hanzlik, PA-C 10/23/11 1345

## 2011-10-23 NOTE — ED Notes (Signed)
Per EMS: Pt was found by daughter laying in floor. Pt states she was on her way back from restroom when she fell. Pt states she does not remember how she fell or if she had LOC. Pt is A&O x3 on arrival. CBG 83 pta.

## 2011-10-23 NOTE — ED Provider Notes (Signed)
Joanne Valdez is a 78 y.o. female with syncope after walking to BR. No recent illness. Dizzy prior to syncope. Brief LOC and easily awoke. Eval in ED with normal ECG and elevated first troponin, rose with second. Pt is clinically and lab defined volume depleted; which likely contributes to NSTEMI by demand ischemia. Pt needs observation now, expectant management and close assessment. Pt is alert and comfortable. Cor- RRR. Lungs CTA. Neuro, nonfocal, good strength.    Medical screening examination/treatment/procedure(s) were conducted as a shared visit with non-physician practitioner(s) and myself.  I personally evaluated the patient during the encounter  Flint Melter, MD 10/23/11 2057

## 2011-10-23 NOTE — ED Notes (Signed)
Pt presents to ED after a syncopal episode that occurred this morning at home. Pt states she was walking back from the restroom when she fell. Pt states she does not remember how or why she fell but she states she woke up with her daughter beside her. Pt normally uses a cane in the home but did not have it with her this morning. Pt denies any pain but states she "feels funny".

## 2011-10-23 NOTE — H&P (Signed)
[facility] History and Physical  Joanne Valdez WUJ:[id number] DOB: [birth year] DOA: 10/23/2011  Referring physician: Gray Bernhardt PCP: Carollee Herter, MD   Chief Complaint: Syncopal, chest pain  HPI:  Pleasant 78 yr old AAF presents from home with syncopal episode this morning at 5:30.  Patient states that she normally wakes up at around 5 AM this morning got up went to the restroom and that she was on her way back to bed, she felt "dizzy" and lightheaded and could not make it back to bed.  She remembers falling and hearing a thump when she fell but does not remember anything after that.  Her granddaughter who lives with her thought that she had something and went to see her and noted that the patient was arousable but not coherent.  She asked her to repeat back a sentence and patient could not do so and therefore 911 was called. The patient herself does not state that she had any chest pain at that time however states that she has a vague right-sided discomfort in her chest occasionally and has had this before-has a history of reflux disease.Marland Kitchen Her granddaughter States that the patient was back to her normal self width and about 5-10 minutes and did not notice any drooping of the mouth or any slurring speech.  Patient however was noted to be "shaking". Patient usually is very stable on her feet-she lives at home with her great-granddaughter and can do most of her ADLs  Emergency room workup showed that the patient had slight worsening of BUN/creatinine ratio 42/1.46 > 20/1.13 Cardiac enzymes = CK-MB 5.1, troponin 1.16 EKG = normal sinus rhythm no T wave inversions Chest x-ray = mild cardiomegaly without evidence of acute cardiopulmonary disease, unchanged right She was bolused IV fluids as her blood pressures were in the 70s over 50s initially and her blood pressure responded to this   Review of Systems:  Denies fever, chills, nausea, vomiting, abdominal pain, dysuria, blurred  vision, double vision, + rhinorrhea, no dark or tarry stool No weakness on any one side of the body, no rash or exposure recently to anyone that has been ill.  Past Medical History  Diagnosis Date  . Diabetes mellitus   . Obesity   . Hypertension   . Gout   . Dyslipidemia   . Arthritis     RHEUMATOID  . Glaucoma   . Colonic polyp   . Anemia   . Renal insufficiency    Chart review-seen in emergency room 05/31/11 4 evaluation of chest pain after a trip and fall. Status post left knee replacement 03/2911 Moderately well controlled diabetes mellitus-A1c noted to be 5.5 in 2012 Well-controlled hypertension  Past Surgical History  Procedure Date  . Thyroidectomy   . Nephrectomy     right - 1980  . Eye surgery     bilateral cataracts removed   . Total knee arthroplasty 04/26/2011    Procedure: TOTAL KNEE ARTHROPLASTY;  Surgeon: Shelda Pal, MD;  Location: [facility];  Service: Orthopedics;  Laterality: Left;   Social History:  reports that she quit smoking about 30 years ago. She has never used smokeless tobacco. She reports that she does not drink alcohol or use illicit drugs.     Allergies  Allergen Reactions  . Phenobarbital Hives    Family History  Problem Relation Age of Onset  . Hypertension Maternal Grandmother      Prior to Admission medications   Medication Sig Start Date End Date Taking? Authorizing  Provider  acetaminophen (TYLENOL) 500 MG tablet Take 500-1,000 mg by mouth every 6 (six) hours as needed.   Yes Historical Provider, MD  allopurinol (ZYLOPRIM) 300 MG tablet Take 1 tablet (300 mg total) by mouth daily. 07/18/11  Yes Ronnald Nian, MD  amLODipine (NORVASC) 10 MG tablet Take 1 tablet (10 mg total) by mouth daily with breakfast. 07/18/11  Yes Ronnald Nian, MD  atorvastatin (LIPITOR) 40 MG tablet Take 1 tablet (40 mg total) by mouth daily with breakfast. 08/30/11  Yes Ronnald Nian, MD  Cholecalciferol (VITAMIN D3) 1000 UNITS CAPS Take 1,000 mg by mouth  daily.    Yes Historical Provider, MD  ferrous sulfate 325 (65 FE) MG tablet Take 325 mg by mouth daily with breakfast.   Yes Historical Provider, MD  folic acid (FOLVITE) 1 MG tablet Take 1 mg by mouth daily.    Yes Historical Provider, MD  lisinopril-hydrochlorothiazide (PRINZIDE,ZESTORETIC) 20-12.5 MG per tablet TAKE ONE TABLET BY MOUTH EVERY DAY 08/19/11  Yes Ronnald Nian, MD  LUMIGAN 0.01 % SOLN Place 1 drop into both eyes at bedtime.  03/25/11  Yes Historical Provider, MD  metFORMIN (GLUCOPHAGE-XR) 500 MG 24 hr tablet Take 1 tablet (500 mg total) by mouth daily with breakfast. 08/31/11  Yes Ronnald Nian, MD  methotrexate (RHEUMATREX) 2.5 MG tablet Take 10 mg by mouth once a week. Take on Thursday   Yes Historical Provider, MD  metoprolol succinate (TOPROL-XL) 50 MG 24 hr tablet Take 1 tablet (50 mg total) by mouth daily. Take with or immediately following a meal. 04/20/11 04/19/12 Yes Wendall Stade, MD  Multiple Vitamin (MULITIVITAMIN WITH MINERALS) TABS Take 1 tablet by mouth daily.    Yes Historical Provider, MD  predniSONE (DELTASONE) 5 MG tablet Take 5 mg by mouth daily.    Yes Historical Provider, MD  HYDROcodone-acetaminophen (NORCO) 7.5-325 MG per tablet Take 1 tablet by mouth every 4 (four) hours as needed. pain 05/21/11   Historical Provider, MD   Physical Exam: Filed Vitals:   10/23/11 0930 10/23/11 0946 10/23/11 1000 10/23/11 1001  BP: 90/53 75/56 94/57  99/57  Pulse: 87 92 88 86  Temp:    98.3 F (36.8 C)  TempSrc:    Oral  Resp: 24 19 12 11   SpO2: 92% 95% 100% 100%     General:  Alert pleasant African American female in no apparent distress.  Edentulous.   Eyes: Arcus senilis noted no pallor or icterus  ENT: Soft, nontender and nondistended neck-no injection to neck  Neck: No carotid bruit-no JVD  Cardiovascular: S1-S2 no murmur rub or gallop regular rate and rhythm  Respiratory: Clinically clear no tactile vocal resonance or fremitus  Abdomen: Soft nontender  nondistended no rebound no guarding midline hysterectomy scar noted  Skin: No lower extremity edema scar over left knee  Musculoskeletal: Moving all 4 extremities equally  Psychiatric: Euthymic and congruent  Neurologic: Motor is 5/5 bilaterally, sensory grossly intact-patient has no cerebellar signs or other issues  Labs on Admission:  Basic Metabolic Panel:  Lab 10/23/11 4098  NA 140  K 3.9  CL 104  CO2 25  GLUCOSE 105*  BUN 42*  CREATININE 1.46*  CALCIUM 12.6*  MG --  PHOS --   Liver Function Tests: No results found for this basename: AST:5,ALT:5,ALKPHOS:5,BILITOT:5,PROT:5,ALBUMIN:5 in the last 168 hours No results found for this basename: LIPASE:5,AMYLASE:5 in the last 168 hours No results found for this basename: AMMONIA:5 in the last 168 hours CBC:  Lab 10/23/11 0845  WBC 9.5  NEUTROABS --  HGB 11.6*  HCT 35.9*  MCV 97.3  PLT 157   Cardiac Enzymes:  Lab 10/23/11 0845  CKTOTAL --  CKMB --  CKMBINDEX --  TROPONINI 0.76*    BNP (last 3 results) No results found for this basename: PROBNP:3 in the last 8760 hours CBG: No results found for this basename: GLUCAP:5 in the last 168 hours  Radiological Exams on Admission: Dg Chest 2 View  10/23/2011  *RADIOLOGY REPORT*  Clinical Data: 78 year old female with cough and congestion.  CHEST - 2 VIEW  Comparison: 05/25/2011 chest radiograph  Findings: Mild cardiomegaly is again identified. Right basilar scarring is unchanged. There is no evidence of focal airspace disease, pulmonary edema, suspicious pulmonary nodule/mass, pleural effusion, or pneumothorax. No acute bony abnormalities are identified. Severe degenerative changes of the shoulders and moderate degenerative changes within the thoracic spine again noted.  IMPRESSION: Mild cardiomegaly without evidence of acute cardiopulmonary disease.  Unchanged right basilar scarring.  Original Report Authenticated By: Rosendo Gros, M.D.    EKG: Independently reviewed.   EKG shows normal sinus rhythm with normal PR interval and axis of about 70.  There are no specific changes from EKG done 05/25/2011  Assessment/Plan Principal Problem:  *Syncope Active Problems:  Diabetes mellitus  Rheumatoid arthritis  Hyperlipidemia LDL goal <70  Anemia  Hypertension associated with diabetes   1. Syncopal episode-etiology secondary to acute volume depletion, however given the fact that there was some time interval between the time that she was found down and her postoperative state, I feel it is prudent to at least rule out by CT scan of the head a TIA.  She has risk factors including hypertension diabetes and hyperlipidemia.  We will get the CT scan of the brain and if that is negative, probably not work up further given that she has no overt clinical findings to suggest any specific lesion of the brain-she may benefit from outpatient carotid studies. 2. Elevated cardiac enzymes-she has vague chest pain and although has a clinical history of reflux disease, I will consult cardiology to ensure that this is not a coronary equivalent.  We will get an echocardiogram.  I did discuss this case with Dr. Johney Frame of cardiology who states that we should cycle cardiac enzymes and if patient is asymptomatic and echocardiogram is normal, potentially may benefit from Myoview stress test-assistance appreciated.  Continue aspirin 325 daily 3. Acute kidney injury secondary to diuretic-will discontinue her lisinopril-hydrochlorothiazide combination.  We will also hold her metoprolol 50 mg XL today.  Continue amlodipine 10 mg tomorrow morning.  Will rehydrate with IV fluids normal saline 100 cc per hour 4. Diabetes mellitus, last A1c 5.5 in 2012-currently on metformin at home 500 mg daily-may have contributed to #3.  Check every morning.  5. Compensated chronic diastolic heart failure EF 60% [id number].   hold any diuretics for now 6. History of rheumatoid arthritis-continue methotrexate  for now. 7. Anemia-of iron deficiency-stable 8. Hyperlipidemia-continue statin  Code Status: Full code Family Communication: Discussed with family and son Cristal Deer in room Disposition Plan: Inpatient  Time spent: 1 hour  Mahala Menghini [facility] Pager [phone]  If 7PM-7AM, please contact night-coverage [email or web address] Password [facility] 10/23/2011, 11:07 AM

## 2011-10-23 NOTE — ED Provider Notes (Signed)
Joanne Valdez is a 78 y.o. female being evaluated for syncope with preceding dizziness. She went to the floor, and was unconscious for a few minutes. No injuries. She has mild, right anterior chest discomfort. That feels like "a need to belch". The discomfort improves when she belches. No shortness of breath, nausea, vomiting, sweating, change in bowel or urinary habits. Her prior cardiac disease. She is alert and calm. In emergency department. She was hypotensive. After walking to 75/56. The blood pressure improved when she laid down to 94/57. She is receiving fluid boluses, first 500 cc, now, 250 cc. Initial troponin is elevated. Repeat is pending (10:30). Aspirin has been ordered.   Case discussed with on-call cardiologist, Dr. Johney Frame. He would like the patient to be admitted by hospitalist service and he will consult as needed. Discussed with, hospitalist; he'll see the patient in emergency department.    CRITICAL CARE Performed by: Flint Melter   Total critical care time: 40 min  Critical care time was exclusive of separately billable procedures and treating other patients.  Critical care was necessary to treat or prevent imminent or life-threatening deterioration.  Critical care was time spent personally by me on the following activities: development of treatment plan with patient and/or surrogate as well as nursing, discussions with consultants, evaluation of patient's response to treatment, examination of patient, obtaining history from patient or surrogate, ordering and performing treatments and interventions, ordering and review of laboratory studies, ordering and review of radiographic studies, pulse oximetry and re-evaluation of patient's condition.  MDM: Syncope with elevated troponin, dehydration, and vague chest pain. She's been treated with aspirin for possible, ACS. She'll need to be admitted for further evaluation and treatment and responsive to fluids, and being supine for a  low blood pressure. She appears volume depleted. Etiology following depletion. Is not clear.  Diagnoses: 1. Syncope 2. Dehydration 3. atypical, for cardiac, chest pain  Flint Melter, MD 10/23/11 1113

## 2011-10-24 ENCOUNTER — Encounter (HOSPITAL_COMMUNITY): Payer: Self-pay | Admitting: *Deleted

## 2011-10-24 DIAGNOSIS — E119 Type 2 diabetes mellitus without complications: Secondary | ICD-10-CM

## 2011-10-24 DIAGNOSIS — R55 Syncope and collapse: Secondary | ICD-10-CM

## 2011-10-24 DIAGNOSIS — D649 Anemia, unspecified: Secondary | ICD-10-CM

## 2011-10-24 DIAGNOSIS — R079 Chest pain, unspecified: Secondary | ICD-10-CM

## 2011-10-24 LAB — CARDIAC PANEL(CRET KIN+CKTOT+MB+TROPI): Relative Index: INVALID (ref 0.0–2.5)

## 2011-10-24 LAB — COMPREHENSIVE METABOLIC PANEL
AST: 25 U/L (ref 0–37)
Albumin: 3.1 g/dL — ABNORMAL LOW (ref 3.5–5.2)
Alkaline Phosphatase: 68 U/L (ref 39–117)
BUN: 31 mg/dL — ABNORMAL HIGH (ref 6–23)
CO2: 21 mEq/L (ref 19–32)
Chloride: 107 mEq/L (ref 96–112)
Creatinine, Ser: 1.21 mg/dL — ABNORMAL HIGH (ref 0.50–1.10)
GFR calc non Af Amer: 41 mL/min — ABNORMAL LOW (ref >90)
Potassium: 4.4 mEq/L (ref 3.5–5.1)
Total Bilirubin: 0.5 mg/dL (ref 0.3–1.2)

## 2011-10-24 LAB — GLUCOSE, CAPILLARY

## 2011-10-24 MED ORDER — DOCUSATE SODIUM 100 MG PO CAPS
100.0000 mg | ORAL_CAPSULE | Freq: Two times a day (BID) | ORAL | Status: DC
  Administered 2011-10-24 – 2011-10-25 (×2): 100 mg via ORAL
  Filled 2011-10-24 (×3): qty 1

## 2011-10-24 NOTE — Progress Notes (Signed)
PROGRESS NOTE  Joanne Valdez XLK:[id number] DOB: Jun 28, 1926 DOA: 10/23/2011 PCP: Carollee Herter, MD  Brief narrative: 79 y/o admit with likely syncopal episodes  Past medical history-As per problem list chart reviewed as below- Chart review-seen in emergency room 05/31/11 4 evaluation of chest pain after a trip and fall.  Status post left knee replacement 03/2911  Moderately well controlled diabetes mellitus-A1c noted to be 5.5 in 2012  Well-controlled hypertension    Consultants:  none  Procedures:  CXR 2 view 10/23/11=Mild cardiomegaly  Ct head  10/23/2011=No Acute intracranial anomally + mild atrophy + mild periventricular white matter probably  2/2 to chronic small vessel isch changes  Antibiotics:  none   Subjective  Well.  No CP/SOB/N/v/Blurred or double vision.  Is constipated. Tolerating PO Up and walking Family in room   Objective    Interim History: Database reviewed  Telemetry: NSR  Objective: Filed Vitals:   10/23/11 1145 10/23/11 1300 10/23/11 2253 10/24/11 0607  BP: 118/70 95/80 107/69 105/69  Pulse: 94 80 80 74  Temp:  97.4 F (36.3 C) 99 F (37.2 C) 98.7 F (37.1 C)  TempSrc:  Oral Oral Oral  Resp: 18 20 18 18   Height:  5\' 5"  (1.651 m)    Weight:  90.719 kg (200 lb)    SpO2: 99% 95% 99% 95%    Intake/Output Summary (Last 24 hours) at 10/24/11 1247 Last data filed at 10/24/11 0700  Gross per 24 hour  Intake 2330.2 ml  Output    600 ml  Net 1730.2 ml    Exam:  General: Alert pleasant African American female in no apparent distress. Edentulous.  Eyes: Arcus senilis noted no pallor or icterus  ENT: Soft, nontender and nondistended neck-no injection to neck  Neck: No carotid bruit-no JVD  Cardiovascular: S1-S2 no murmur rub or gallop regular rate and rhythm  Respiratory: Clinically clear no tactile vocal resonance or fremitus  Abdomen: Soft nontender nondistended no rebound no guarding midline hysterectomy scar noted  Skin: No  lower extremity edema scar over left knee  Musculoskeletal: Moving all 4 extremities equally  Psychiatric: Euthymic and congruent  Neurologic: Motor is 5/5 bilaterally, sensory grossly intact-patient has no cerebellar signs or other issues    Data Reviewed: Basic Metabolic Panel:  Lab 10/24/11 3664 10/23/11 0845  NA 138 140  K 4.4 3.9  CL 107 104  CO2 21 25  GLUCOSE 162* 105*  BUN 31* 42*  CREATININE 1.21* 1.46*  CALCIUM 11.4* 12.6*  MG -- --  PHOS -- --   Liver Function Tests:  Lab 10/24/11 0357  AST 25  ALT 22  ALKPHOS 68  BILITOT 0.5  PROT 6.3  ALBUMIN 3.1*   No results found for this basename: LIPASE:5,AMYLASE:5 in the last 168 hours No results found for this basename: AMMONIA:5 in the last 168 hours CBC:  Lab 10/23/11 0845  WBC 9.5  NEUTROABS --  HGB 11.6*  HCT 35.9*  MCV 97.3  PLT 157   Cardiac Enzymes:  Lab 10/24/11 0307 10/23/11 1926 10/23/11 1037 10/23/11 0845  CKTOTAL 51 80 82 --  CKMB 2.7 4.7* 5.1* --  CKMBINDEX -- -- -- --  TROPONINI 0.43* 0.69* 1.16* 0.76*   BNP: No components found with this basename: POCBNP:5 CBG:  Lab 10/24/11 1149 10/24/11 0719  GLUCAP 92 115*    No results found for this or any previous visit (from the past 240 hour(s)).   Studies:  All Imaging reviewed and is as per above notation   Scheduled Meds:   . allopurinol  300 mg Oral Daily  . atorvastatin  40 mg Oral QPC supper  . bimatoprost  1 drop Both Eyes QHS  . enoxaparin (LOVENOX) injection  30 mg Subcutaneous Q12H  . ferrous sulfate  325 mg Oral Daily  . fluticasone  2 spray Each Nare Daily  . folic acid  1 mg Oral Daily  . methotrexate  10 mg Oral Weekly  . multivitamin with minerals  1 tablet Oral Daily  . predniSONE  5 mg Oral Daily  . DISCONTD: amLODipine  10 mg Oral Daily  . DISCONTD: methotrexate  10 mg Oral Weekly   Continuous Infusions:   . sodium chloride 1,000 mL (10/23/11 1849)     Assessment/Plan:  Assessment/Plan    Principal Problem:  *Syncope  Active Problems:  Diabetes mellitus  Rheumatoid arthritis  Hyperlipidemia LDL goal <70  Anemia  Hypertension associated with diabetes   1. Syncopal episode-etiology secondary to acute volume depletion.  Ct scan head neg.  Await Echo.   2. NSTEMI-Type 2 MI- Dr. Johney Frame of cardiology phone consulted 7/28-CE's x 3 neg.  Await echo potential stress test out-patient. Continue aspirin 325 daily 3. Acute kidney injury secondary to diuretic-will discontinue her lisinopril-hydrochlorothiazide combination. We will also hold her metoprolol 50 mg XL today.  Continue amlodipine 10 mg tomorrow morning.  IV fluids normal saline 100 cc per hour dc/'d 10/24/11.  Force fluids. 4. Diabetes mellitus, last A1c 5.5 in 2012-currently on metformin at home 500 mg daily-may have contributed to #3 Check CBG every morning-d/c   5. Compensated chronic diastolic heart failure EF 60% 13/24/4010-[id number]. hold any diuretics for now. 6. History of rheumatoid arthritis-continue methotrexate and prednisone. 7. Anemia-of iron deficiency-stable 8. Hyperlipidemia-continue statin  Code Status: Full  Family Communication: Will update in am Disposition Plan: Likely home if Renal function closer to baseline in am   Pleas Koch, MD  [facility] Pager [phone] 10/24/2011, 12:47 PM    LOS: 1 day

## 2011-10-24 NOTE — Progress Notes (Signed)
  Echocardiogram 2D Echocardiogram has been performed.  Libertie Hausler 10/24/2011, 9:55 AM

## 2011-10-25 DIAGNOSIS — R55 Syncope and collapse: Secondary | ICD-10-CM

## 2011-10-25 LAB — CBC
HCT: 29.2 % — ABNORMAL LOW (ref 36.0–46.0)
Hemoglobin: 9.5 g/dL — ABNORMAL LOW (ref 12.0–15.0)
MCH: 31.7 pg (ref 26.0–34.0)
MCHC: 32.5 g/dL (ref 30.0–36.0)
MCV: 97.3 fL (ref 78.0–100.0)
RBC: 3 MIL/uL — ABNORMAL LOW (ref 3.87–5.11)

## 2011-10-25 LAB — BASIC METABOLIC PANEL
BUN: 23 mg/dL (ref 6–23)
CO2: 24 mEq/L (ref 19–32)
GFR calc non Af Amer: 44 mL/min — ABNORMAL LOW (ref >90)
Glucose, Bld: 82 mg/dL (ref 70–99)
Potassium: 3.9 mEq/L (ref 3.5–5.1)
Sodium: 141 mEq/L (ref 135–145)

## 2011-10-25 NOTE — Progress Notes (Signed)
Pt walked in hallway, and tolerated it well. Pt walked greater than 40 feet with a walker, and stated that she also has one for home use. Pt 02 sats were greater than 90% while ambulating.   MCCLAIN, Janat Tabbert L 10/25/2011

## 2011-10-25 NOTE — Progress Notes (Signed)
Per MD order patient must walk once prior to leaving and can D/C tele.   MCCLAIN, Lekita Kerekes L 10:54 AM 10/25/2011

## 2011-10-25 NOTE — Progress Notes (Signed)
Pt was given d/c instructions with son. Pt verbalized understanding of d/c instructions and the importance of following up with PCP and stopping the drugs outlined in her AVS. PT was wheeled to front entrance by NT and all personal belongs were at her side. Pt was transported home via son.   MCCLAIN, Joanne Valdez 10/25/2011 12:16 PM

## 2011-10-25 NOTE — Discharge Summary (Signed)
Physician Discharge Summary  Joanne Valdez UJW:[id number] DOB: [birth year] DOA: 10/23/2011  PCP: Carollee Herter, MD  Admit date: 10/23/2011 Discharge date: 10/25/2011  Recommendations for Outpatient Follow-up:  1. Patient will need reimplementation of antihypertensives per primary care physician 2. I discontinue metformin-her A1c in 2012 was 5.5 3. Recommend basic metabolic panel in one week 4. Need followup with primary care physician in 5-7 days   Discharge Diagnoses:  Principal Problem:  *Syncope Active Problems:  Diabetes mellitus  Rheumatoid arthritis  Hyperlipidemia LDL goal <70  Anemia  Hypertension associated with diabetes   Brief narrative:  78 y/o admit with likely syncopal episodes.  She a focal on the morning of 728 and felt lightheaded and her granddaughter came to help her where she was noted to be somewhat confused.  She always has vague chest pain but a history of reflux.  Emergency room workup showed that the patient had slight worsening of BUN/creatinine ratio 42/1.46 > 20/1.13  Cardiac enzymes = CK-MB 5.1, troponin 1.16  EKG = normal sinus rhythm no T wave inversions  Chest x-ray = mild cardiomegaly without evidence of acute cardiopulmonary disease, unchanged right  She was bolused IV fluids as her blood pressures were in the 70s over 50s initially and her blood pressure responded to this  Please see below for full hospital course  Past medical history-As per problem list chart reviewed as below-  Chart review-seen in emergency room 05/31/11 4 evaluation of chest pain after a trip and fall.  Status post left knee replacement 03/2911  Moderately well controlled diabetes mellitus-A1c noted to be 5.5 in 2012  Well-controlled hypertension    Consultants:  Telephone consulted Dr. Johney Frame of cardiology  Procedures:  CXR 2 view 10/23/11=Mild cardiomegaly  Ct head 10/23/2011=No Acute intracranial anomally + mild atrophy + mild periventricular white matter  probably 2/2 to chronic small vessel isch changes Echocardiogram 7/29 = EF of 60% no wall motion abnormalities, increased PA peak pressure 41 mm mercury  Antibiotics:  none  Discharge Condition: Stable  Diet recommendation: Diabetic diet  Wt Readings from Last 3 Encounters:  10/23/11 90.719 kg (200 lb)  07/12/11 92.08 kg (203 lb)  04/26/11 95.2 kg (209 lb 14.1 oz)    Hospital Course:  1. Syncopal episode-etiology secondary to acute volume depletion, however given the fact that there was some time interval between the time that she was found down and her postoperative state,  CT scan head was done which was negative -further CVA workup was held.  This is thought to be secondary to volume depletion and her lisinopril/HCTZ was discontinued as was her metformin-consider careful use of allopurinol and methotrexate as an outpatient 2. Elevated cardiac enzymes-she has vague chest pain and although has a clinical history of reflux disease, I telephone consulted cardiology to ensure that this is not a coronary equivalent.  3 sets of cardiac enzymes were negative, echocardiogram = negative for wall motion abnormality  3. Acute kidney injury secondary to diuretic-discontinued her lisinopril-hydrochlorothiazide combination. We will also hold her metoprolol 50 mg XL today. Continue amlodipine 10 mg tomorrow morning. Will rehydrate with IV fluids normal saline 100 cc per hour 4. Diabetes mellitus, last A1c 5.5 in 2012-currently on metformin at home 500 mg daily-blood sugars in the hospital for less than 140 fasting every morning.  See #1 5. Compensated chronic diastolic heart failure EF 60%  10/24/2011  6. History of rheumatoid arthritis-continue methotrexate for now. 7. Anemia-of iron deficiency-stable 8. Hyperlipidemia-continue statin   Discharge Exam: Filed  Vitals:   10/25/11 0604  BP: 124/59  Pulse: 74  Temp: 98.6 F (37 C)  Resp: 18   Filed Vitals:   10/24/11 0607 10/24/11 1346 10/24/11  2146 10/25/11 0604  BP: 105/69 101/55 130/53 124/59  Pulse: 74 78 90 74  Temp: 98.7 F (37.1 C) 98.8 F (37.1 C) 98.2 F (36.8 C) 98.6 F (37 C)  TempSrc: Oral Oral Oral Oral  Resp: 18 20 18 18   Height:      Weight:      SpO2: 95% 97% 98% 100%   General: Alert pleasant African American female in no apparent distress. Edentulous.  Eyes: Arcus senilis noted no pallor or icterus  ENT: Soft, nontender and nondistended neck-no injection to neck  Neck: No carotid bruit-no JVD  Cardiovascular: S1-S2 no murmur rub or gallop regular rate and rhythm  Respiratory: Clinically clear no tactile vocal resonance or fremitus  Abdomen: Soft nontender nondistended no rebound no guarding midline hysterectomy scar noted  Skin: No lower extremity edema scar over left knee   Discharge Instructions   Medication List  As of 10/25/2011 11:06 AM   ASK your doctor about these medications         acetaminophen 500 MG tablet   Commonly known as: TYLENOL   Take 500-1,000 mg by mouth every 6 (six) hours as needed.      allopurinol 300 MG tablet   Commonly known as: ZYLOPRIM   Take 1 tablet (300 mg total) by mouth daily.      amLODipine 10 MG tablet   Commonly known as: NORVASC   Take 1 tablet (10 mg total) by mouth daily with breakfast.      atorvastatin 40 MG tablet   Commonly known as: LIPITOR   Take 1 tablet (40 mg total) by mouth daily with breakfast.      ferrous sulfate 325 (65 FE) MG tablet   Take 325 mg by mouth daily with breakfast.      folic acid 1 MG tablet   Commonly known as: FOLVITE   Take 1 mg by mouth daily.      HYDROcodone-acetaminophen 7.5-325 MG per tablet   Commonly known as: NORCO   Take 1 tablet by mouth every 4 (four) hours as needed. pain      lisinopril-hydrochlorothiazide 20-12.5 MG per tablet   Commonly known as: PRINZIDE,ZESTORETIC   TAKE ONE TABLET BY MOUTH EVERY DAY      LUMIGAN 0.01 % Soln   Generic drug: bimatoprost   Place 1 drop into both eyes at  bedtime.      metFORMIN 500 MG 24 hr tablet   Commonly known as: GLUCOPHAGE-XR   Take 1 tablet (500 mg total) by mouth daily with breakfast.      methotrexate 2.5 MG tablet   Commonly known as: RHEUMATREX   Take 10 mg by mouth once a week. Take on Thursday      metoprolol succinate 50 MG 24 hr tablet   Commonly known as: TOPROL-XL   Take 1 tablet (50 mg total) by mouth daily. Take with or immediately following a meal.      multivitamin with minerals Tabs   Take 1 tablet by mouth daily.      predniSONE 5 MG tablet   Commonly known as: DELTASONE   Take 5 mg by mouth daily.      Vitamin D3 1000 UNITS Caps   Take 1,000 mg by mouth daily.  The results of significant diagnostics from this hospitalization (including imaging, microbiology, ancillary and laboratory) are listed below for reference.    Significant Diagnostic Studies: Dg Chest 2 View  10/23/2011  *RADIOLOGY REPORT*  Clinical Data: 78 year old female with cough and congestion.  CHEST - 2 VIEW  Comparison: 05/25/2011 chest radiograph  Findings: Mild cardiomegaly is again identified. Right basilar scarring is unchanged. There is no evidence of focal airspace disease, pulmonary edema, suspicious pulmonary nodule/mass, pleural effusion, or pneumothorax. No acute bony abnormalities are identified. Severe degenerative changes of the shoulders and moderate degenerative changes within the thoracic spine again noted.  IMPRESSION: Mild cardiomegaly without evidence of acute cardiopulmonary disease.  Unchanged right basilar scarring.  Original Report Authenticated By: Rosendo Gros, M.D.   Ct Head Wo Contrast  10/23/2011  *RADIOLOGY REPORT*  Clinical Data: Syncope  CT HEAD WITHOUT CONTRAST  Technique:  Contiguous axial images were obtained from the base of the skull through the vertex without contrast.  Comparison: None.  Findings: No skull fracture is noted.  Paranasal sinuses and mastoid air cells are unremarkable.  No  intracranial hemorrhage, mass effect or midline shift.  No acute infarction.  No mass lesion is noted on this unenhanced scan.  Mild cerebral atrophy.  Mild periventricular white matter decreased attenuation probable due to chronic small vessel ischemic changes.  Probable small lacunar infarct left external capsule.  IMPRESSION: No acute intracranial abnormality.  Mild cerebral atrophy.  Mild periventricular white matter decreased attenuation probable due to chronic small vessel ischemic changes.  Original Report Authenticated By: Natasha Mead, M.D.    Microbiology: No results found for this or any previous visit (from the past 240 hour(s)).   Labs: Basic Metabolic Panel:  Lab 10/25/11 1610 10/24/11 0357 10/23/11 0845  NA 141 138 140  K 3.9 4.4 3.9  CL 109 107 104  CO2 24 21 25   GLUCOSE 82 162* 105*  BUN 23 31* 42*  CREATININE 1.12* 1.21* 1.46*  CALCIUM 11.0* 11.4* 12.6*  MG -- -- --  PHOS -- -- --   Liver Function Tests:  Lab 10/24/11 0357  AST 25  ALT 22  ALKPHOS 68  BILITOT 0.5  PROT 6.3  ALBUMIN 3.1*   No results found for this basename: LIPASE:5,AMYLASE:5 in the last 168 hours No results found for this basename: AMMONIA:5 in the last 168 hours CBC:  Lab 10/25/11 0445 10/23/11 0845  WBC 8.4 9.5  NEUTROABS -- --  HGB 9.5* 11.6*  HCT 29.2* 35.9*  MCV 97.3 97.3  PLT 123* 157   Cardiac Enzymes:  Lab 10/24/11 0307 10/23/11 1926 10/23/11 1037 10/23/11 0845  CKTOTAL 51 80 82 --  CKMB 2.7 4.7* 5.1* --  CKMBINDEX -- -- -- --  TROPONINI 0.43* 0.69* 1.16* 0.76*   BNP: BNP (last 3 results) No results found for this basename: PROBNP:3 in the last 8760 hours CBG:  Lab 10/25/11 0711 10/24/11 1149 10/24/11 0719  GLUCAP 77 92 115*    Time coordinating discharge:  45 minutes minutes  Signed:  Rhetta Mura  [facility] 10/25/2011, 11:03 AM

## 2011-10-28 ENCOUNTER — Ambulatory Visit (INDEPENDENT_AMBULATORY_CARE_PROVIDER_SITE_OTHER): Payer: Medicare Other | Admitting: Family Medicine

## 2011-10-28 ENCOUNTER — Encounter: Payer: Self-pay | Admitting: Family Medicine

## 2011-10-28 VITALS — BP 120/70 | HR 73 | Wt 202.0 lb

## 2011-10-28 DIAGNOSIS — I10 Essential (primary) hypertension: Secondary | ICD-10-CM | POA: Diagnosis not present

## 2011-10-28 DIAGNOSIS — M069 Rheumatoid arthritis, unspecified: Secondary | ICD-10-CM

## 2011-10-28 DIAGNOSIS — E119 Type 2 diabetes mellitus without complications: Secondary | ICD-10-CM | POA: Diagnosis not present

## 2011-10-28 DIAGNOSIS — E1169 Type 2 diabetes mellitus with other specified complication: Secondary | ICD-10-CM | POA: Diagnosis not present

## 2011-10-28 DIAGNOSIS — E785 Hyperlipidemia, unspecified: Secondary | ICD-10-CM

## 2011-10-28 DIAGNOSIS — R55 Syncope and collapse: Secondary | ICD-10-CM

## 2011-10-28 NOTE — Progress Notes (Signed)
  Subjective:    Patient ID: Joanne Valdez, female    DOB: [birth year], 78 y.o.   MRN: [id number]  HPI She is here for recheck. She was seen and admitted recently. The emergency room and discharge summary was reviewed. Her hemoglobin A1c was 5.5. Her renal functions were apparently elevated. The lisinopril/HCTZ was held. She is here for recheck. She is having no chest pain, shortness of breath. She does have slight leg edema. She continues on medications listed in the chart.  Review of Systems     Objective:   Physical Exam Alert and in no distress otherwise not examined.       Assessment & Plan:   1. Diabetes mellitus   2. Hyperlipidemia LDL goal <70   3. Hypertension associated with diabetes   4. Rheumatoid arthritis   5. Syncope    Will continue to monitor her blood pressure and blood sugars. Her granddaughter will be monitoring this. We discussed proper blood pressure readings as well as blood sugar. I will not add back the metformin or lisinopril.

## 2011-10-28 NOTE — Patient Instructions (Signed)
Monitor the blood pressure weekly and as long as it's under 130/80 we'll leave her alone. Check the blood sugars either before a meal or 2 hours after a meal. In the low 180 and I will leave you alone

## 2011-11-14 DIAGNOSIS — M069 Rheumatoid arthritis, unspecified: Secondary | ICD-10-CM | POA: Diagnosis not present

## 2012-01-20 ENCOUNTER — Encounter: Payer: Self-pay | Admitting: Internal Medicine

## 2012-01-30 ENCOUNTER — Ambulatory Visit (INDEPENDENT_AMBULATORY_CARE_PROVIDER_SITE_OTHER): Payer: Medicare Other | Admitting: Family Medicine

## 2012-01-30 ENCOUNTER — Encounter: Payer: Self-pay | Admitting: Family Medicine

## 2012-01-30 VITALS — BP 124/70 | HR 58 | Wt 215.0 lb

## 2012-01-30 DIAGNOSIS — Z23 Encounter for immunization: Secondary | ICD-10-CM

## 2012-01-30 DIAGNOSIS — I10 Essential (primary) hypertension: Secondary | ICD-10-CM

## 2012-01-30 DIAGNOSIS — E119 Type 2 diabetes mellitus without complications: Secondary | ICD-10-CM

## 2012-01-30 DIAGNOSIS — E785 Hyperlipidemia, unspecified: Secondary | ICD-10-CM | POA: Diagnosis not present

## 2012-01-30 DIAGNOSIS — E1169 Type 2 diabetes mellitus with other specified complication: Secondary | ICD-10-CM | POA: Diagnosis not present

## 2012-01-30 DIAGNOSIS — H409 Unspecified glaucoma: Secondary | ICD-10-CM

## 2012-01-30 DIAGNOSIS — M069 Rheumatoid arthritis, unspecified: Secondary | ICD-10-CM

## 2012-01-30 MED ORDER — INFLUENZA VIRUS VACC SPLIT PF IM SUSP: 0.5000 mL | Freq: Once | INTRAMUSCULAR | Status: DC

## 2012-01-30 NOTE — Progress Notes (Signed)
  Subjective:    Patient ID: Joanne Valdez, female    DOB: Apr 12, 1926, 78 y.o.   MRN: [id number]  HPI She is here for a recheck. She has no particular concerns or complaints. Her exercise is quite minimal. She does not smoke or drink. She does get regular eye checks and does have a history of glaucoma and has had cataracts removed. She continues on her meds for her rheumatoid arthritis and is doing well on these. They include prednisone 5 mg.   Review of Systems     Objective:   Physical Exam  Alert and in no distress otherwise not examined. Hemoglobin A1c is 5.4.      Assessment & Plan:   1. Diabetes mellitus  POCT glycosylated hemoglobin (Hb A1C)  2. Need for prophylactic vaccination and inoculation against influenza  influenza  inactive virus vaccine (FLUZONE/FLUARIX) injection 0.5 mL  3. Hyperlipidemia LDL goal <70    4. Hypertension associated with diabetes    5. Rheumatoid arthritis    6. Glaucoma     encouraged her to continue to take good care of herself. Flu shot given with risks and benefits discussed. Recheck here in 4 months.

## 2012-02-04 ENCOUNTER — Other Ambulatory Visit: Payer: Self-pay | Admitting: Family Medicine

## 2012-04-11 DIAGNOSIS — M069 Rheumatoid arthritis, unspecified: Secondary | ICD-10-CM | POA: Diagnosis not present

## 2012-05-24 DIAGNOSIS — H04129 Dry eye syndrome of unspecified lacrimal gland: Secondary | ICD-10-CM | POA: Diagnosis not present

## 2012-05-24 DIAGNOSIS — H01009 Unspecified blepharitis unspecified eye, unspecified eyelid: Secondary | ICD-10-CM | POA: Diagnosis not present

## 2012-05-29 ENCOUNTER — Ambulatory Visit: Payer: Medicare Other | Admitting: Family Medicine

## 2012-05-30 ENCOUNTER — Other Ambulatory Visit: Payer: Self-pay | Admitting: *Deleted

## 2012-05-30 MED ORDER — METOPROLOL SUCCINATE ER 50 MG PO TB24: 50.0000 mg | ORAL_TABLET | Freq: Every day | ORAL | Status: DC

## 2012-06-06 DIAGNOSIS — M1710 Unilateral primary osteoarthritis, unspecified knee: Secondary | ICD-10-CM | POA: Diagnosis not present

## 2012-06-26 DIAGNOSIS — E109 Type 1 diabetes mellitus without complications: Secondary | ICD-10-CM | POA: Diagnosis not present

## 2012-06-26 DIAGNOSIS — H26499 Other secondary cataract, unspecified eye: Secondary | ICD-10-CM | POA: Diagnosis not present

## 2012-06-26 DIAGNOSIS — H4011X0 Primary open-angle glaucoma, stage unspecified: Secondary | ICD-10-CM | POA: Diagnosis not present

## 2012-06-26 DIAGNOSIS — H01009 Unspecified blepharitis unspecified eye, unspecified eyelid: Secondary | ICD-10-CM | POA: Diagnosis not present

## 2012-07-17 ENCOUNTER — Encounter: Payer: Self-pay | Admitting: Cardiovascular Disease

## 2012-07-17 ENCOUNTER — Ambulatory Visit (INDEPENDENT_AMBULATORY_CARE_PROVIDER_SITE_OTHER): Payer: Medicare Other | Admitting: Cardiovascular Disease

## 2012-07-17 VITALS — BP 122/70 | HR 72 | Wt 228.0 lb

## 2012-07-17 DIAGNOSIS — I10 Essential (primary) hypertension: Secondary | ICD-10-CM

## 2012-07-17 DIAGNOSIS — E119 Type 2 diabetes mellitus without complications: Secondary | ICD-10-CM

## 2012-07-17 DIAGNOSIS — E1159 Type 2 diabetes mellitus with other circulatory complications: Secondary | ICD-10-CM

## 2012-07-17 DIAGNOSIS — E1169 Type 2 diabetes mellitus with other specified complication: Secondary | ICD-10-CM | POA: Diagnosis not present

## 2012-07-17 DIAGNOSIS — R000 Tachycardia, unspecified: Secondary | ICD-10-CM

## 2012-07-17 DIAGNOSIS — E785 Hyperlipidemia, unspecified: Secondary | ICD-10-CM | POA: Diagnosis not present

## 2012-07-17 NOTE — Patient Instructions (Signed)
Your physician recommends that you schedule a follow-up appointment in: AS NEEDED  Your physician recommends that you continue on your current medications as directed. Please refer to the Current Medication list given to you today.  

## 2012-07-17 NOTE — Progress Notes (Signed)
Patient ID: Joanne Valdez, female   DOB: [birth year], 79 y.o.   MRN: [id number] 79 yo S/P surgery with Dr Charlann Boxer scheduled for 1/29. Seen by primary Susann Givens. Has always had high HR. Anemic with Hct about 30. Negative w/u including colonoscopy. Previous right kidney removal in 80;s. No previous cardiac history. Activity limited by knee pain not dyspnea or SSCP Mild LE edema with varicosities LLE worse than right. Had 3/4 of her thyroid removed a long time ago. No recent TSH/T4 in Epic. No previous anesthetic complications or bleeding problems. No chronic lung disease, CAD, or arrythmia other than tachycardia Preop echo 1/13 was normal. Had vague SSCP a few weeks ago with negative ER evaluation and no recurrence. Tolerating beta blocker. No other new problems  Mild chronic LE edema form varicose veins  Chronic arthritis in knees  ROS: Denies fever, malais, weight loss, blurry vision, decreased visual acuity, cough, sputum, SOB, hemoptysis, pleuritic pain, palpitaitons, heartburn, abdominal pain, melena, lower extremity edema, claudication, or rash.  All other systems reviewed and negative  General: Affect appropriate Healthy:  appears stated age HEENT: normal Neck supple with no adenopathy JVP normal no bruits no thyromegaly Lungs clear with no wheezing and good diaphragmatic motion Heart:  S1/S2 no murmur, no rub, gallop or click PMI normal Abdomen: benighn, BS positve, no tenderness, no AAA no bruit.  No HSM or HJR Distal pulses intact with no bruits Plus one LLE  Edema with varicose veins Neuro non-focal Skin warm and dry No muscular weakness   Current Outpatient Prescriptions  Medication Sig Dispense Refill  . acetaminophen (TYLENOL) 500 MG tablet Take 500-1,000 mg by mouth every 6 (six) hours as needed.      Marland Kitchen allopurinol (ZYLOPRIM) 300 MG tablet Take 1 tablet (300 mg total) by mouth daily.  30 tablet  11  . amLODipine (NORVASC) 10 MG tablet Take 1 tablet (10 mg total) by mouth daily  with breakfast.  30 tablet  11  . aspirin 81 MG tablet Take 81 mg by mouth daily.      Marland Kitchen atorvastatin (LIPITOR) 40 MG tablet TAKE ONE TABLET BY MOUTH EVERY DAY WITH BREAKFAST  30 tablet  5  . Cholecalciferol (VITAMIN D3) 1000 UNITS CAPS Take 1,000 mg by mouth daily.       . ferrous sulfate 325 (65 FE) MG tablet Take 325 mg by mouth daily with breakfast.      . folic acid (FOLVITE) 1 MG tablet Take 1 mg by mouth daily.       Marland Kitchen LUMIGAN 0.01 % SOLN Place 1 drop into both eyes at bedtime.       . methotrexate (RHEUMATREX) 2.5 MG tablet Take 10 mg by mouth once a week. Take on Thursday      . metoprolol succinate (TOPROL-XL) 50 MG 24 hr tablet Take 1 tablet (50 mg total) by mouth daily. Take with or immediately following a meal.  30 tablet  11  . predniSONE (DELTASONE) 5 MG tablet Take 5 mg by mouth daily.        No current facility-administered medications for this visit.    Allergies  Phenobarbital  Electrocardiogram:  10/24/11  SR rate 91 normal  Assessment and Plan

## 2012-07-17 NOTE — Assessment & Plan Note (Signed)
Discussed low carb diet.  Target hemoglobin A1c is 6.5 or less.  Continue current medications.  

## 2012-07-17 NOTE — Assessment & Plan Note (Signed)
Well controlled.  Continue current medications and low sodium Dash type diet.    

## 2012-07-17 NOTE — Assessment & Plan Note (Signed)
Functional In good range with beta blocker. No further w/u needed

## 2012-07-17 NOTE — Assessment & Plan Note (Signed)
Cholesterol is at goal.  Continue current dose of statin and diet Rx.  No myalgias or side effects.  F/U  LFT's in 6 months. Lab Results  Component Value Date   LDLCALC 82 12/01/2010

## 2012-07-26 ENCOUNTER — Ambulatory Visit (INDEPENDENT_AMBULATORY_CARE_PROVIDER_SITE_OTHER): Payer: Medicare Other | Admitting: Family Medicine

## 2012-07-26 ENCOUNTER — Encounter: Payer: Self-pay | Admitting: Family Medicine

## 2012-07-26 VITALS — BP 118/78 | HR 62 | Wt 225.0 lb

## 2012-07-26 DIAGNOSIS — E785 Hyperlipidemia, unspecified: Secondary | ICD-10-CM

## 2012-07-26 DIAGNOSIS — I10 Essential (primary) hypertension: Secondary | ICD-10-CM

## 2012-07-26 DIAGNOSIS — E119 Type 2 diabetes mellitus without complications: Secondary | ICD-10-CM

## 2012-07-26 DIAGNOSIS — E669 Obesity, unspecified: Secondary | ICD-10-CM | POA: Insufficient documentation

## 2012-07-26 DIAGNOSIS — E1169 Type 2 diabetes mellitus with other specified complication: Secondary | ICD-10-CM | POA: Diagnosis not present

## 2012-07-26 LAB — POCT GLYCOSYLATED HEMOGLOBIN (HGB A1C): Hemoglobin A1C: 5.7

## 2012-07-26 LAB — POCT UA - MICROALBUMIN
Albumin/Creatinine Ratio, Urine, POC: <4.1
Creatinine, POC: 122.5 mg/dL
Microalbumin Ur, POC: <5 mg/dL

## 2012-07-26 NOTE — Progress Notes (Signed)
  Subjective:    Patient ID: Joanne Valdez, female    DOB: July 02, 1926, 79 y.o.   MRN: [id number]  HPI    Review of Systems     Objective:   Physical Exam        Assessment & Plan:

## 2012-07-26 NOTE — Progress Notes (Deleted)
Subjective:    Joanne Valdez is a 79 y.o. female who presents for follow-up of Type 2 diabetes mellitus.    Home blood sugar records: 70 TO 90  Current symptoms/problems include NONE Daily foot checks, foot concerns: YES 01/2012 DR.LALONDE Last eye exam:  04/14    Medication compliance: Current diet: JUST WATCH WHAT SHE EATS Current exercise: WALKING Known diabetic complications: {diabetes complications:1215} Cardiovascular risk factors: {cv risk factors:510}   {Common ambulatory SmartLinks:19316}  ROS as in subjective above    Objective:    There were no vitals taken for this visit.  There were no vitals filed for this visit.  General appearence: alert, no distress, WD/WN Neck: supple, no lymphadenopathy, no thyromegaly, no masses Heart: RRR, normal S1, S2, no murmurs Lungs: CTA bilaterally, no wheezes, rhonchi, or rales Abdomen: +bs, soft, non tender, non distended, no masses, no hepatomegaly, no splenomegaly Pulses: 2+ symmetric, upper and lower extremities, normal cap refill Ext: no edema Foot exam:  Neuro: foot monofilament exam normal   Lab Review Lab Results  Component Value Date   HGBA1C 5.4 01/30/2012   Lab Results  Component Value Date   CHOL 168 12/01/2010   HDL 75 12/01/2010   LDLCALC 82 12/01/2010   TRIG 53 12/01/2010   CHOLHDL 2.2 12/01/2010   No results found for this basenameConcepcion Elk     Chemistry      Component Value Date/Time   NA 141 10/25/2011 0445   K 3.9 10/25/2011 0445   CL 109 10/25/2011 0445   CO2 24 10/25/2011 0445   BUN 23 10/25/2011 0445   CREATININE 1.12* 10/25/2011 0445   CREATININE 1.59* 04/08/2011 1407      Component Value Date/Time   CALCIUM 11.0* 10/25/2011 0445   CALCIUM 12.4* 04/14/2011 1358   ALKPHOS 68 10/24/2011 0357   AST 25 10/24/2011 0357   ALT 22 10/24/2011 0357   BILITOT 0.5 10/24/2011 0357        Chemistry      Component Value Date/Time   NA 141 10/25/2011 0445   K 3.9 10/25/2011 0445   CL 109  10/25/2011 0445   CO2 24 10/25/2011 0445   BUN 23 10/25/2011 0445   CREATININE 1.12* 10/25/2011 0445   CREATININE 1.59* 04/08/2011 1407      Component Value Date/Time   CALCIUM 11.0* 10/25/2011 0445   CALCIUM 12.4* 04/14/2011 1358   ALKPHOS 68 10/24/2011 0357   AST 25 10/24/2011 0357   ALT 22 10/24/2011 0357   BILITOT 0.5 10/24/2011 0357       Last optometry/ophthalmology exam reviewed from:    Assessment:        Plan:    1.  Rx changes: {none:33079} 2.  Education: Reviewed 'ABCs' of diabetes management (respective goals in parentheses):  A1C (<7), blood pressure (<130/80), and cholesterol (LDL <100). 3.  Compliance at present is estimated to be {good/fair/poor:33178}. Efforts to improve compliance (if necessary) will be directed at {compliance:16716}. 4. Follow up: {NUMBERS; 1-61:09604} {time:11}    Subjective:    Joanne Valdez is a 79 y.o. female who presents for follow-up of Type 2 diabetes mellitus.    Home blood sugar records: {diabetes glucometry results:16657}  Current symptoms/problems include {Symptoms; diabetes:14075} and have   been {Desc; course:15616}. Daily foot checks, foot concerns:  Last eye exam:     Medication compliance: Current diet: {diet habits:16563} Current exercise: {exercise types:16438} Known diabetic complications: {diabetes complications:1215} Cardiovascular risk factors: {cv risk factors:510}   {Common ambulatory SmartLinks:19316}  ROS as in subjective above    Objective:    There were no vitals taken for this visit.  There were no vitals filed for this visit.  General appearence: alert, no distress, WD/WN Neck: supple, no lymphadenopathy, no thyromegaly, no masses Heart: RRR, normal S1, S2, no murmurs Lungs: CTA bilaterally, no wheezes, rhonchi, or rales Abdomen: +bs, soft, non tender, non distended, no masses, no hepatomegaly, no splenomegaly Pulses: 2+ symmetric, upper and lower extremities, normal cap refill Ext: no  edema Foot exam:  Neuro: foot monofilament exam normal   Lab Review Lab Results  Component Value Date   HGBA1C 5.4 01/30/2012   Lab Results  Component Value Date   CHOL 168 12/01/2010   HDL 75 12/01/2010   LDLCALC 82 12/01/2010   TRIG 53 12/01/2010   CHOLHDL 2.2 12/01/2010   No results found for this basenameConcepcion Elk     Chemistry      Component Value Date/Time   NA 141 10/25/2011 0445   K 3.9 10/25/2011 0445   CL 109 10/25/2011 0445   CO2 24 10/25/2011 0445   BUN 23 10/25/2011 0445   CREATININE 1.12* 10/25/2011 0445   CREATININE 1.59* 04/08/2011 1407      Component Value Date/Time   CALCIUM 11.0* 10/25/2011 0445   CALCIUM 12.4* 04/14/2011 1358   ALKPHOS 68 10/24/2011 0357   AST 25 10/24/2011 0357   ALT 22 10/24/2011 0357   BILITOT 0.5 10/24/2011 0357        Chemistry      Component Value Date/Time   NA 141 10/25/2011 0445   K 3.9 10/25/2011 0445   CL 109 10/25/2011 0445   CO2 24 10/25/2011 0445   BUN 23 10/25/2011 0445   CREATININE 1.12* 10/25/2011 0445   CREATININE 1.59* 04/08/2011 1407      Component Value Date/Time   CALCIUM 11.0* 10/25/2011 0445   CALCIUM 12.4* 04/14/2011 1358   ALKPHOS 68 10/24/2011 0357   AST 25 10/24/2011 0357   ALT 22 10/24/2011 0357   BILITOT 0.5 10/24/2011 0357       Last optometry/ophthalmology exam reviewed from:    Assessment:        Plan:    1.  Rx changes: {none:33079} 2.  Education: Reviewed 'ABCs' of diabetes management (respective goals in parentheses):  A1C (<7), blood pressure (<130/80), and cholesterol (LDL <100). 3.  Compliance at present is estimated to be {good/fair/poor:33178}. Efforts to improve compliance (if necessary) will be directed at {compliance:16716}. 4. Follow up: {NUMBERS; 0-10:33138} {time:11}

## 2012-07-26 NOTE — Progress Notes (Signed)
Subjective:    Joanne Valdez is a 79 y.o. female who presents for follow-up of Type 2 diabetes mellitus.    Home blood sugar records: fasting range: 70 to90  Current symptoms/problems include paresthesia of the feet and have   been unchanged. Daily foot checks, foot concerns:  Last eye exam:     Medication compliance: Current diet: in general, a "healthy" diet   Current exercise: no regular exercise Known diabetic complications: none Cardiovascular risk factors: advanced age (older than 7 for men, 52 for women), diabetes mellitus, dyslipidemia, hypertension, obesity (BMI >= 30 kg/m2) and sedentary lifestyle   The following portions of the patient's history were reviewed and updated as appropriate: allergies, current medications, past family history, past medical history, past social history, past surgical history and problem list.  ROS as in subjective above    Objective:    BP 118/78  Pulse 62  Wt 225 lb (102.059 kg)  BMI 37.44 kg/m2  Filed Vitals:   07/26/12 1044  BP: 118/78  Pulse: 62    General appearence: alert, no distress, WD/WN Neck: supple, no lymphadenopathy, no thyromegaly, no masses Heart: RRR, normal S1, S2, no murmurs Lungs: CTA bilaterally, no wheezes, rhonchi, or rales Abdomen: +bs, soft, non tender, non distended, no masses, no hepatomegaly, no splenomegaly Pulses: 2+ symmetric, upper and lower extremities, normal cap refill Ext: no edema Foot exam:  Neuro: foot monofilament exam normal   Lab Review Lab Results  Component Value Date   HGBA1C 5.7 07/26/2012   Lab Results  Component Value Date   CHOL 168 12/01/2010   HDL 75 12/01/2010   LDLCALC 82 12/01/2010   TRIG 53 12/01/2010   CHOLHDL 2.2 12/01/2010   No results found for this basenameConcepcion Elk     Chemistry      Component Value Date/Time   NA 141 10/25/2011 0445   K 3.9 10/25/2011 0445   CL 109 10/25/2011 0445   CO2 24 10/25/2011 0445   BUN 23 10/25/2011 0445   CREATININE 1.12*  10/25/2011 0445   CREATININE 1.59* 04/08/2011 1407      Component Value Date/Time   CALCIUM 11.0* 10/25/2011 0445   CALCIUM 12.4* 04/14/2011 1358   ALKPHOS 68 10/24/2011 0357   AST 25 10/24/2011 0357   ALT 22 10/24/2011 0357   BILITOT 0.5 10/24/2011 0357        Chemistry      Component Value Date/Time   NA 141 10/25/2011 0445   K 3.9 10/25/2011 0445   CL 109 10/25/2011 0445   CO2 24 10/25/2011 0445   BUN 23 10/25/2011 0445   CREATININE 1.12* 10/25/2011 0445   CREATININE 1.59* 04/08/2011 1407      Component Value Date/Time   CALCIUM 11.0* 10/25/2011 0445   CALCIUM 12.4* 04/14/2011 1358   ALKPHOS 68 10/24/2011 0357   AST 25 10/24/2011 0357   ALT 22 10/24/2011 0357   BILITOT 0.5 10/24/2011 0357     Hemoglobin A1c is 5.7  Last optometry/ophthalmology exam reviewed from:    Assessment:   Encounter Diagnoses  Name Primary?  . Diabetes mellitus Yes  . Hyperlipidemia LDL goal <70   . Hypertension associated with diabetes   . Obesity (BMI 30-39.9)          Plan:    1.  Rx changes: none 2.  Education: Reviewed 'ABCs' of diabetes management (respective goals in parentheses):  A1C (<7), blood pressure (<130/80), and cholesterol (LDL <100). 3.  Compliance at present is estimated  to be fair. Efforts to improve compliance (if necessary) will be directed at No real changes in her present regimen. 4. Follow up: 4 months

## 2012-08-02 DIAGNOSIS — H4011X0 Primary open-angle glaucoma, stage unspecified: Secondary | ICD-10-CM | POA: Diagnosis not present

## 2012-08-02 DIAGNOSIS — E109 Type 1 diabetes mellitus without complications: Secondary | ICD-10-CM | POA: Diagnosis not present

## 2012-08-02 DIAGNOSIS — H01009 Unspecified blepharitis unspecified eye, unspecified eyelid: Secondary | ICD-10-CM | POA: Diagnosis not present

## 2012-08-07 ENCOUNTER — Other Ambulatory Visit: Payer: Self-pay | Admitting: Family Medicine

## 2012-09-04 DIAGNOSIS — H4011X0 Primary open-angle glaucoma, stage unspecified: Secondary | ICD-10-CM | POA: Diagnosis not present

## 2012-09-04 DIAGNOSIS — E109 Type 1 diabetes mellitus without complications: Secondary | ICD-10-CM | POA: Diagnosis not present

## 2012-09-04 DIAGNOSIS — H01009 Unspecified blepharitis unspecified eye, unspecified eyelid: Secondary | ICD-10-CM | POA: Diagnosis not present

## 2012-09-04 DIAGNOSIS — H26499 Other secondary cataract, unspecified eye: Secondary | ICD-10-CM | POA: Diagnosis not present

## 2012-11-12 DIAGNOSIS — M069 Rheumatoid arthritis, unspecified: Secondary | ICD-10-CM | POA: Diagnosis not present

## 2012-11-22 DIAGNOSIS — H353190 Nonexudative age-related macular degeneration, unspecified eye, stage unspecified: Secondary | ICD-10-CM | POA: Diagnosis not present

## 2012-11-22 DIAGNOSIS — H4011X0 Primary open-angle glaucoma, stage unspecified: Secondary | ICD-10-CM | POA: Diagnosis not present

## 2012-11-22 DIAGNOSIS — H26499 Other secondary cataract, unspecified eye: Secondary | ICD-10-CM | POA: Diagnosis not present

## 2012-11-22 DIAGNOSIS — E109 Type 1 diabetes mellitus without complications: Secondary | ICD-10-CM | POA: Diagnosis not present

## 2012-11-29 ENCOUNTER — Ambulatory Visit: Payer: Medicare Other | Admitting: Family Medicine

## 2013-02-09 IMAGING — CR DG CHEST 2V
2 series · 2 of 2 positions shown · non-contrast
Comparison: 05/26/2010.

CLINICAL DATA: Postop.  Chest pain.

CHEST - 2 VIEW

[w chest pa]
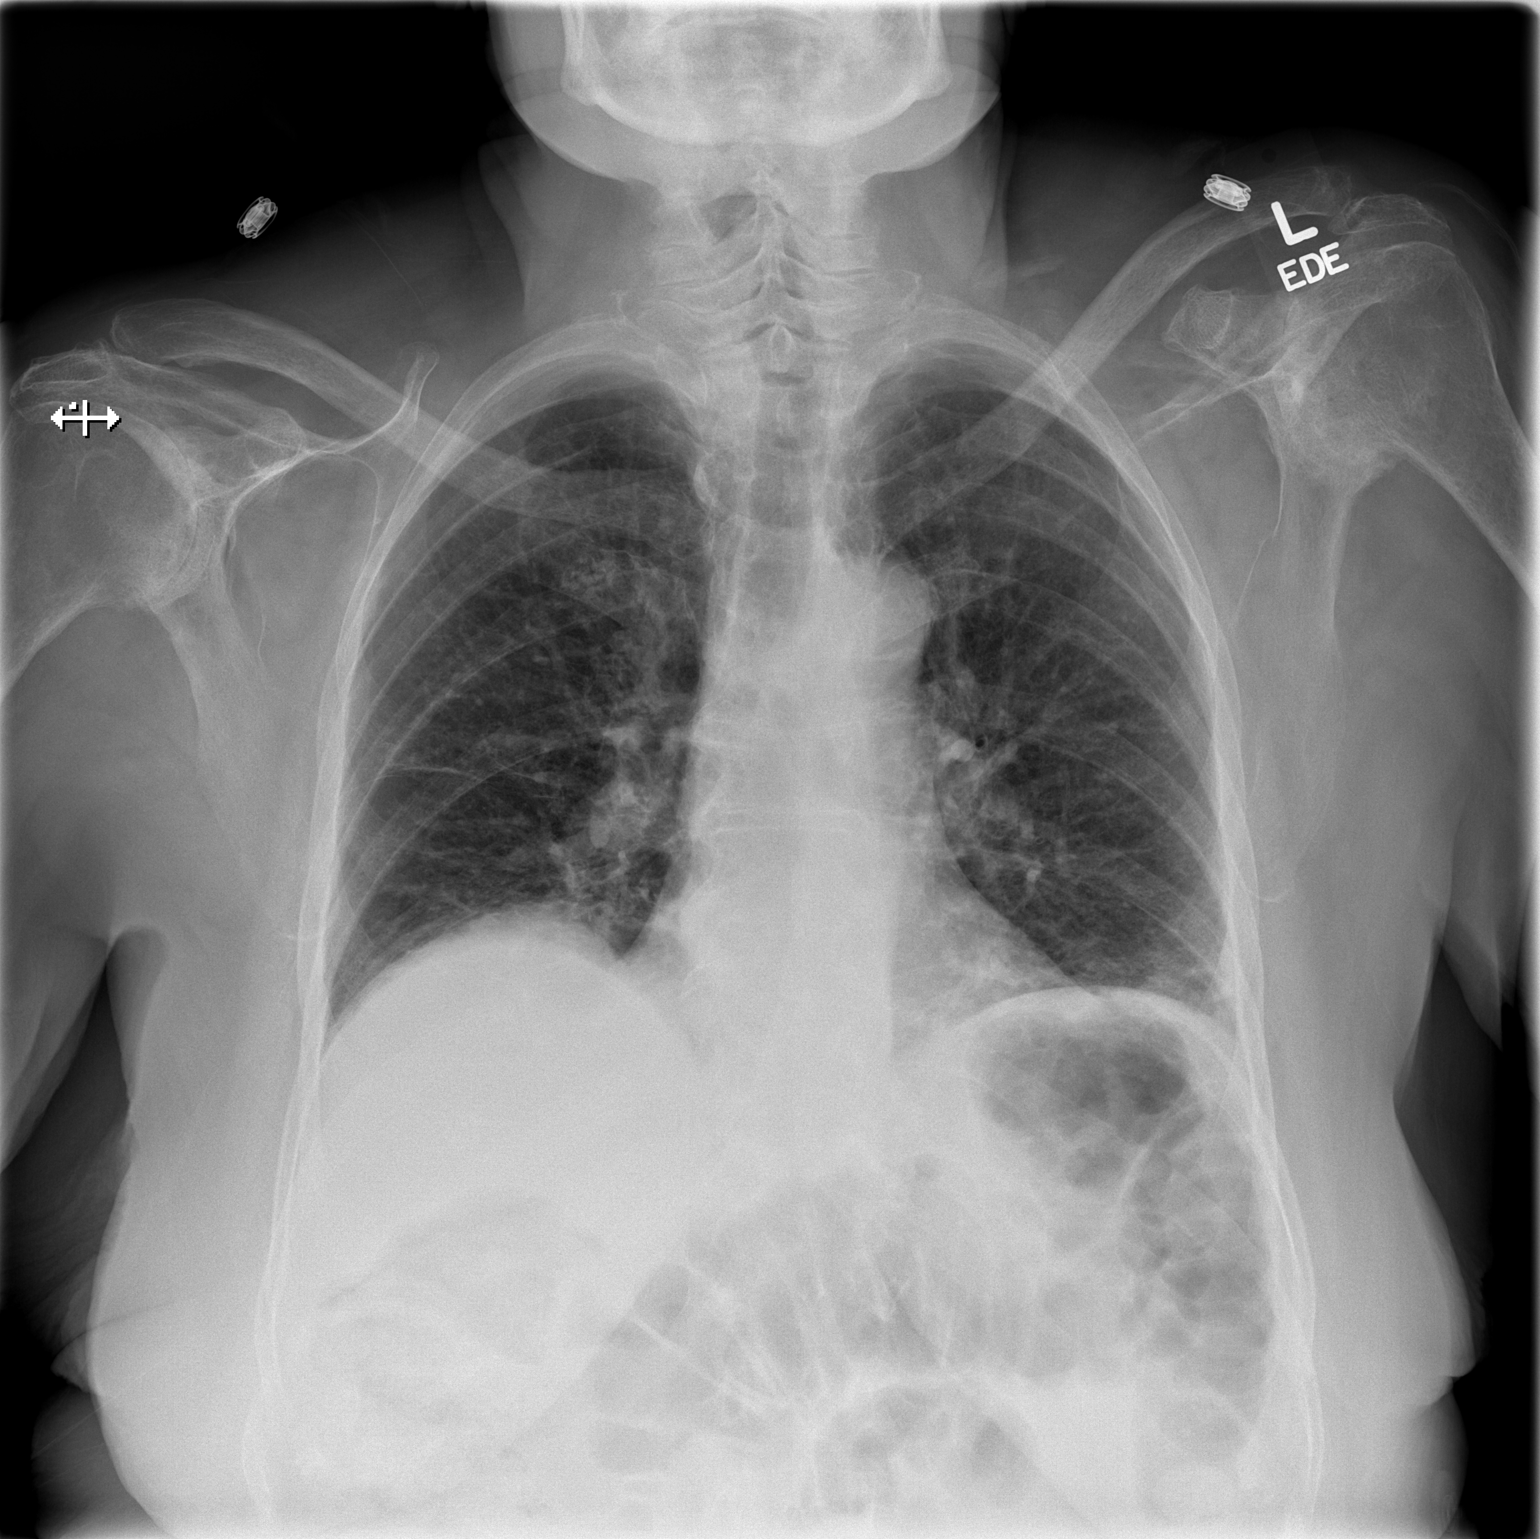

[w chest lat]
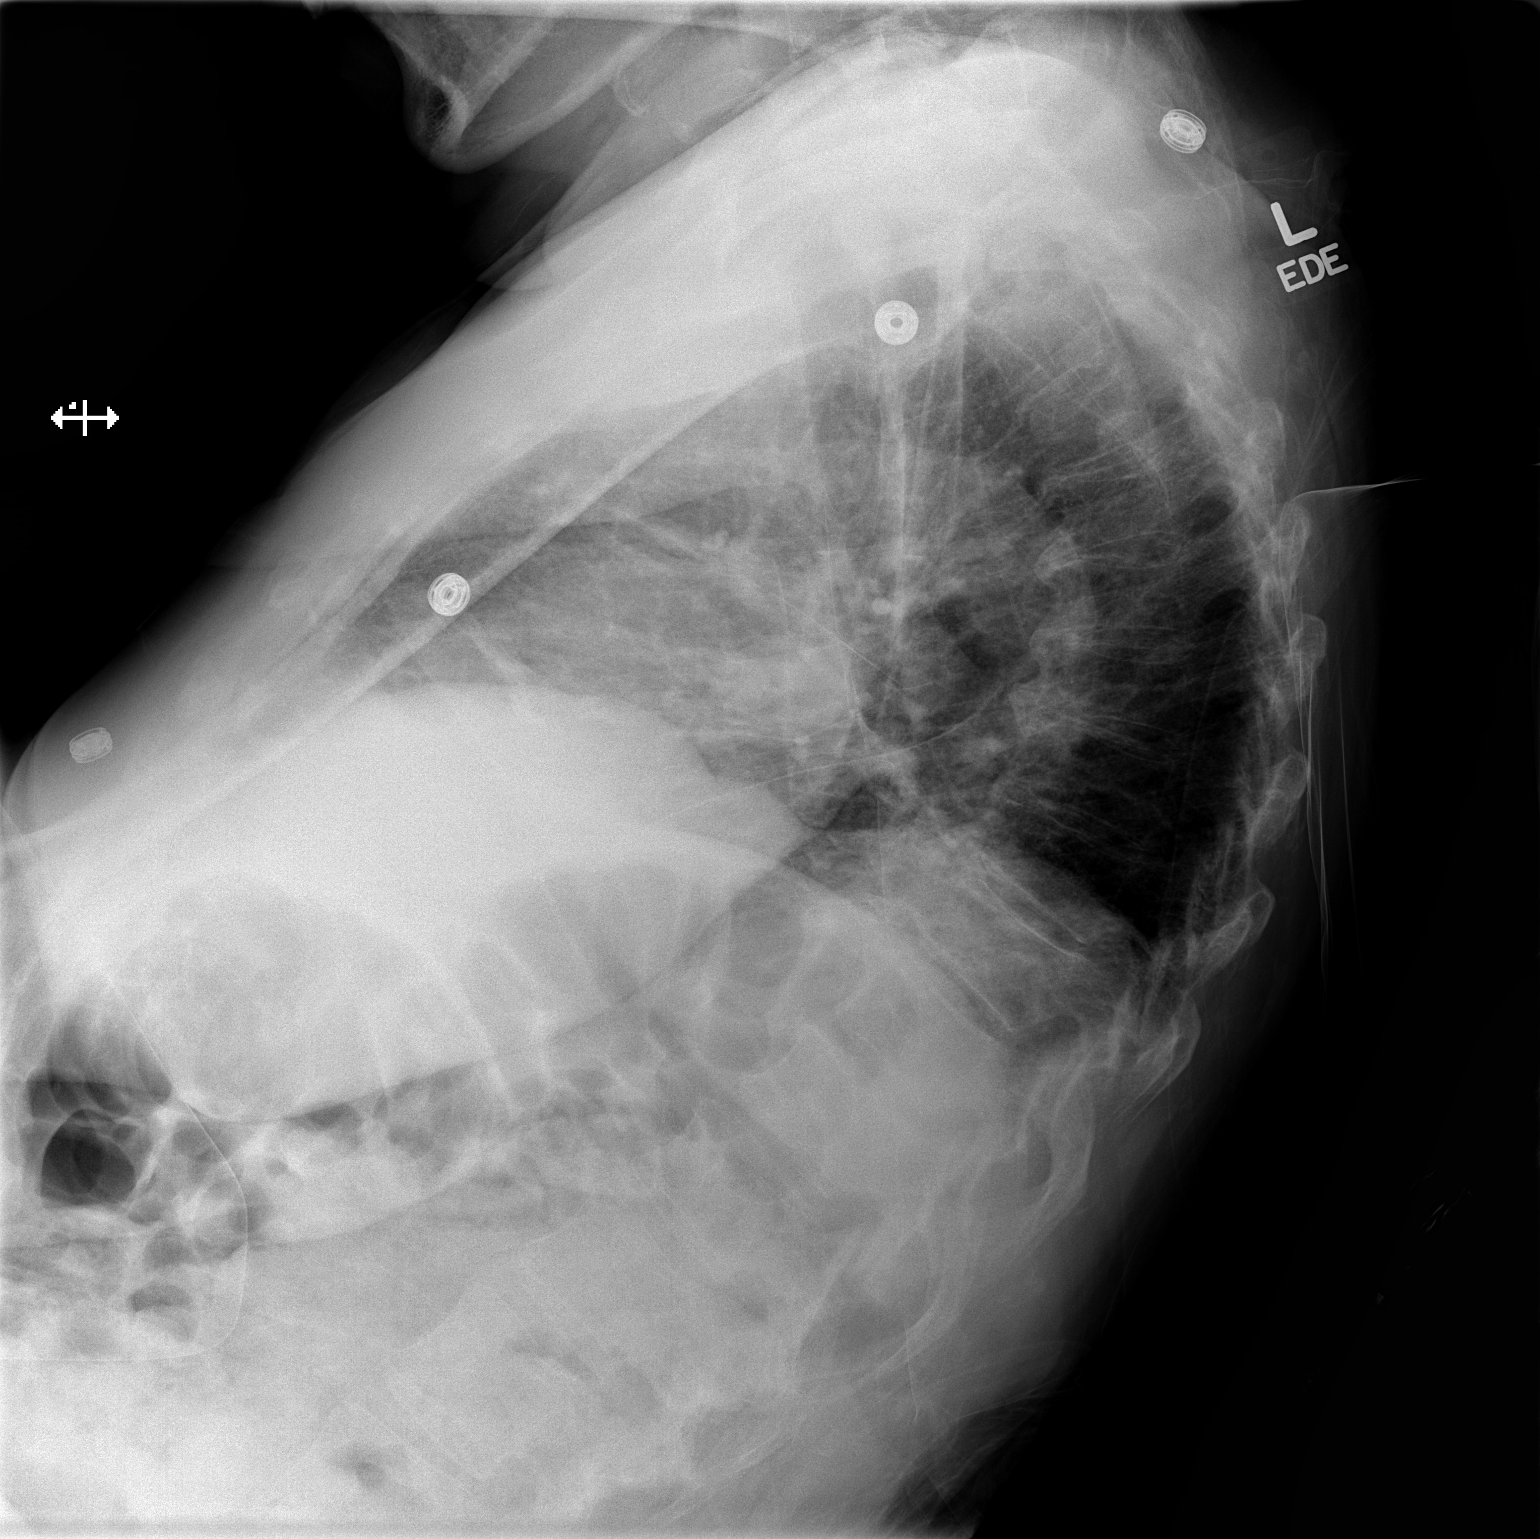

[2 of 2 positions shown; findings below may reference images not displayed]

FINDINGS: Trachea is midline. Trachea is midline.  Heart size is
difficult to assess, given low lung volumes.  Mild bibasilar
atelectasis.  No definite pleural fluid.  Degenerative changes are
seen in the shoulders and spine.
IMPRESSION: Low lung volumes with bibasilar atelectasis.

## 2013-02-13 ENCOUNTER — Encounter: Payer: Self-pay | Admitting: Family Medicine

## 2013-02-13 ENCOUNTER — Ambulatory Visit (INDEPENDENT_AMBULATORY_CARE_PROVIDER_SITE_OTHER): Payer: Medicare Other | Admitting: Family Medicine

## 2013-02-13 VITALS — BP 120/72 | HR 60 | Wt 225.0 lb

## 2013-02-13 DIAGNOSIS — E785 Hyperlipidemia, unspecified: Secondary | ICD-10-CM | POA: Diagnosis not present

## 2013-02-13 DIAGNOSIS — E669 Obesity, unspecified: Secondary | ICD-10-CM | POA: Diagnosis not present

## 2013-02-13 DIAGNOSIS — E1169 Type 2 diabetes mellitus with other specified complication: Secondary | ICD-10-CM | POA: Diagnosis not present

## 2013-02-13 DIAGNOSIS — I10 Essential (primary) hypertension: Secondary | ICD-10-CM | POA: Diagnosis not present

## 2013-02-13 DIAGNOSIS — E119 Type 2 diabetes mellitus without complications: Secondary | ICD-10-CM | POA: Diagnosis not present

## 2013-02-13 DIAGNOSIS — Z96659 Presence of unspecified artificial knee joint: Secondary | ICD-10-CM

## 2013-02-13 DIAGNOSIS — Z96652 Presence of left artificial knee joint: Secondary | ICD-10-CM

## 2013-02-13 DIAGNOSIS — Z23 Encounter for immunization: Secondary | ICD-10-CM | POA: Diagnosis not present

## 2013-02-13 DIAGNOSIS — Z79899 Other long term (current) drug therapy: Secondary | ICD-10-CM | POA: Diagnosis not present

## 2013-02-13 DIAGNOSIS — E1159 Type 2 diabetes mellitus with other circulatory complications: Secondary | ICD-10-CM

## 2013-02-13 DIAGNOSIS — M069 Rheumatoid arthritis, unspecified: Secondary | ICD-10-CM

## 2013-02-13 LAB — COMPREHENSIVE METABOLIC PANEL
ALT: 10 U/L (ref 0–35)
AST: 18 U/L (ref 0–37)
Albumin: 3.6 g/dL (ref 3.5–5.2)
Alkaline Phosphatase: 84 U/L (ref 39–117)
BUN: 22 mg/dL (ref 6–23)
Potassium: 4.3 mEq/L (ref 3.5–5.3)
Sodium: 140 mEq/L (ref 135–145)

## 2013-02-13 LAB — CBC WITH DIFFERENTIAL/PLATELET
Basophils Absolute: 0 10*3/uL (ref 0.0–0.1)
Basophils Relative: 0 % (ref 0–1)
Lymphocytes Relative: 16 % (ref 12–46)
MCHC: 33.3 g/dL (ref 30.0–36.0)
Monocytes Absolute: 0.5 10*3/uL (ref 0.1–1.0)
Neutro Abs: 5.7 10*3/uL (ref 1.7–7.7)
Neutrophils Relative %: 76 % (ref 43–77)
Platelets: 152 10*3/uL (ref 150–400)
RDW: 16.3 % — ABNORMAL HIGH (ref 11.5–15.5)
WBC: 7.5 10*3/uL (ref 4.0–10.5)

## 2013-02-13 LAB — LIPID PANEL
Cholesterol: 140 mg/dL (ref 0–200)
HDL: 58 mg/dL (ref >39)
LDL Cholesterol: 69 mg/dL (ref 0–99)
Total CHOL/HDL Ratio: 2.4 Ratio
Triglycerides: 63 mg/dL (ref <150)
VLDL: 13 mg/dL (ref 0–40)

## 2013-02-13 LAB — POCT GLYCOSYLATED HEMOGLOBIN (HGB A1C): Hemoglobin A1C: 5.6

## 2013-02-13 NOTE — Progress Notes (Signed)
Subjective:    Joanne Valdez is a 79 y.o. female who presents for follow-up of Type 2 diabetes mellitus.    Home blood sugar records: 80 T0 100  Current symptoms/problem NONE Daily foot checks: Any foot concerns: YES/LEFT ANK.SWELLING Last eye exam:  09/14   Medication compliance:good Current diet: CUT DOWN ON INTAKE  Current exercise: NO she has had other left TKR and is having difficulty with her right knee and has had injections. Known diabetic complications: none Cardiovascular risk factors: advanced age (older than 86 for men, 89 for women), diabetes mellitus, dyslipidemia, hypertension, obesity (BMI >= 30 kg/m2) and sedentary lifestyle Does complain of left CTS symptoms. She mainly has difficulty with tingling sensation in the thumb index and long finger.  The following portions of the patient's history were reviewed and updated as appropriate: allergies, current medications, past medical history, past social history, past surgical history and problem list.  ROS as in subjective above    Objective:    Wt 225 lb (102.059 kg)   General appearence: alert, no distress, WD/WN Neck: supple, no lymphadenopathy, no thyromegaly, no masses Heart: RRR, normal S1, S2, no murmurs Lungs: CTA bilaterally, no wheezes, rhonchi, or rales Abdomen: +bs, soft, non tender, non distended, no masses, no hepatomegaly, no splenomegaly Pulses: 2+ symmetric, upper and lower extremities, normal cap refill Ext: no edema Foot exam:  Neuro: foot monofilament exam normal   Lab Review Lab Results  Component Value Date   HGBA1C 5.7 07/26/2012   Lab Results  Component Value Date   CHOL 168 12/01/2010   HDL 75 12/01/2010   LDLCALC 82 12/01/2010   TRIG 53 12/01/2010   CHOLHDL 2.2 12/01/2010   No results found for this basenameConcepcion Elk     Chemistry      Component Value Date/Time   NA 141 10/25/2011 0445   K 3.9 10/25/2011 0445   CL 109 10/25/2011 0445   CO2 24 10/25/2011 0445   BUN 23  10/25/2011 0445   CREATININE 1.12* 10/25/2011 0445   CREATININE 1.59* 04/08/2011 1407      Component Value Date/Time   CALCIUM 11.0* 10/25/2011 0445   CALCIUM 12.4* 04/14/2011 1358   ALKPHOS 68 10/24/2011 0357   AST 25 10/24/2011 0357   ALT 22 10/24/2011 0357   BILITOT 0.5 10/24/2011 0357        Chemistry      Component Value Date/Time   NA 141 10/25/2011 0445   K 3.9 10/25/2011 0445   CL 109 10/25/2011 0445   CO2 24 10/25/2011 0445   BUN 23 10/25/2011 0445   CREATININE 1.12* 10/25/2011 0445   CREATININE 1.59* 04/08/2011 1407      Component Value Date/Time   CALCIUM 11.0* 10/25/2011 0445   CALCIUM 12.4* 04/14/2011 1358   ALKPHOS 68 10/24/2011 0357   AST 25 10/24/2011 0357   ALT 22 10/24/2011 0357   BILITOT 0.5 10/24/2011 0357       :    Assessment:   Encounter Diagnoses  Name Primary?  . Diabetes mellitus Yes  . Need for prophylactic vaccination and inoculation against influenza   . Obesity (BMI 30-39.9)   . Hypertension associated with diabetes   . Hyperlipidemia LDL goal <70   . Rheumatoid arthritis   . S/P knee replacement, left   . Encounter for long-term (current) use of other medications          Plan:    1.  Rx changes: none 2.  Education: Reviewed 'ABCs'  of diabetes management (respective goals in parentheses):  A1C (<7), blood pressure (<130/80), and cholesterol (LDL <100). 3.  Compliance at present is estimated to be fair. Efforts to improve compliance (if necessary) will be directed at increased exercise. 4. Follow up: 4 months  Also recommended night splints for her CTS symptoms.

## 2013-02-13 NOTE — Patient Instructions (Signed)
Cut down but don't cut out all your carbohydrates

## 2013-02-14 NOTE — Progress Notes (Signed)
Quick Note:  MAILED PT LETTER OF LAB RESULTS ______ 

## 2013-03-03 ENCOUNTER — Other Ambulatory Visit: Payer: Self-pay | Admitting: Family Medicine

## 2013-06-26 ENCOUNTER — Ambulatory Visit (INDEPENDENT_AMBULATORY_CARE_PROVIDER_SITE_OTHER): Payer: Medicare Other | Admitting: Family Medicine

## 2013-06-26 ENCOUNTER — Encounter: Payer: Self-pay | Admitting: Family Medicine

## 2013-06-26 VITALS — BP 112/70 | HR 72 | Wt 220.0 lb

## 2013-06-26 DIAGNOSIS — L723 Sebaceous cyst: Secondary | ICD-10-CM

## 2013-06-26 DIAGNOSIS — E1159 Type 2 diabetes mellitus with other circulatory complications: Secondary | ICD-10-CM

## 2013-06-26 DIAGNOSIS — E119 Type 2 diabetes mellitus without complications: Secondary | ICD-10-CM

## 2013-06-26 DIAGNOSIS — E1169 Type 2 diabetes mellitus with other specified complication: Secondary | ICD-10-CM | POA: Diagnosis not present

## 2013-06-26 DIAGNOSIS — E785 Hyperlipidemia, unspecified: Secondary | ICD-10-CM

## 2013-06-26 DIAGNOSIS — Z96659 Presence of unspecified artificial knee joint: Secondary | ICD-10-CM | POA: Diagnosis not present

## 2013-06-26 DIAGNOSIS — I152 Hypertension secondary to endocrine disorders: Secondary | ICD-10-CM

## 2013-06-26 DIAGNOSIS — I10 Essential (primary) hypertension: Secondary | ICD-10-CM

## 2013-06-26 DIAGNOSIS — L089 Local infection of the skin and subcutaneous tissue, unspecified: Secondary | ICD-10-CM

## 2013-06-26 LAB — POCT GLYCOSYLATED HEMOGLOBIN (HGB A1C): HEMOGLOBIN A1C: 5.7

## 2013-06-26 NOTE — Progress Notes (Signed)
   Subjective:    Patient ID: Joanne Valdez, female    DOB: 1926-08-12, 80 y.o.   MRN: [id number]  HPI She is here for followup visit. She also has a lesion on her left shoulder that is slightly painful and draining. She also has a dark lesion on her right upper breast that has been there for years. She does check her sugars and usually run in the low 100s. She walks very little. She does keep busy with her ADLs which is her main activity. She has had a left total knee replacement and is doing quite well with that. She notes that she does have right knee pain but is not interested in having surgery. Continues on medications listed in the chart. She does not check her feet regularly and does have help with taking a bath.   Review of Systems     Objective:   Physical Exam Alert and in no distress. Exam of the left arm does show an area of erythema surrounding a slight dimple. Purulent drainage was easily expressed from this summer which appeared to be cyst sac. She also has a sebaceous cyst that I was able to partially evacuated of a blackish thick material. Hemoglobin A1c is 5.7.       Assessment & Plan:  Diabetes mellitus - Plan: HgB A1c  Hyperlipidemia LDL goal <70  Hypertension associated with diabetes  S/P left knee replacement  Infected sebaceous cyst  encouraged her to stay as active as possible and continue on her present medications. Recommend supportive care for the left arm and if the swelling and pain gets worse, she is to call for a recheck. I did not feel that a formal I&D was needed.

## 2013-06-28 ENCOUNTER — Other Ambulatory Visit: Payer: Self-pay | Admitting: Cardiovascular Disease

## 2013-07-01 ENCOUNTER — Telehealth: Payer: Self-pay | Admitting: Internal Medicine

## 2013-07-01 MED ORDER — AMLODIPINE BESYLATE 10 MG PO TABS: 10.0000 mg | ORAL_TABLET | Freq: Every day | ORAL | Status: DC

## 2013-07-01 NOTE — Telephone Encounter (Signed)
Refill request for amlodipine to [facility]

## 2013-07-01 NOTE — Telephone Encounter (Signed)
Medication sent in. 

## 2013-07-10 IMAGING — CR DG CHEST 2V
2 series · 2 of 2 positions shown · non-contrast
Comparison: 05/25/2011 chest radiograph

CLINICAL DATA: 84-year-old female with cough and congestion.

CHEST - 2 VIEW

[w chest lat]
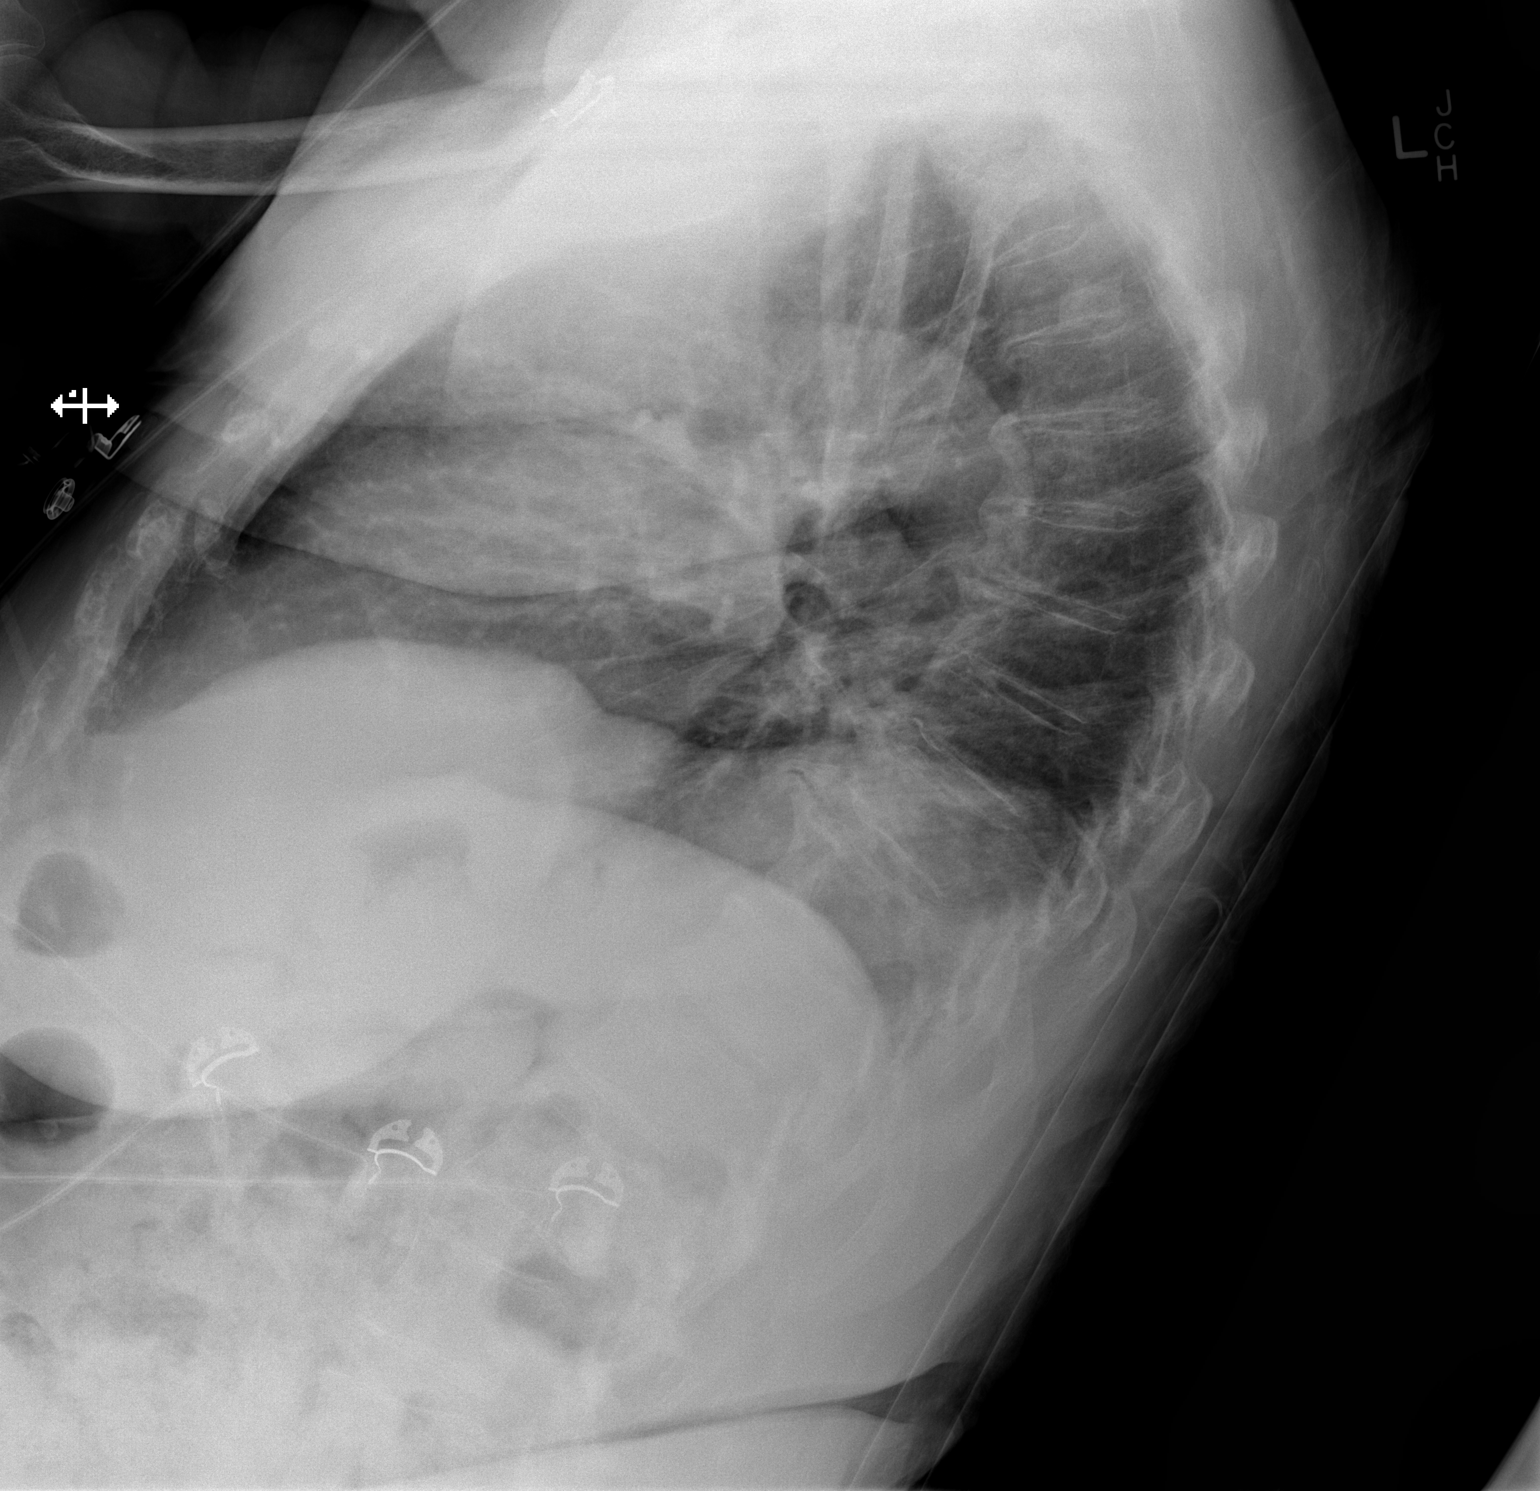

[x chest ap]
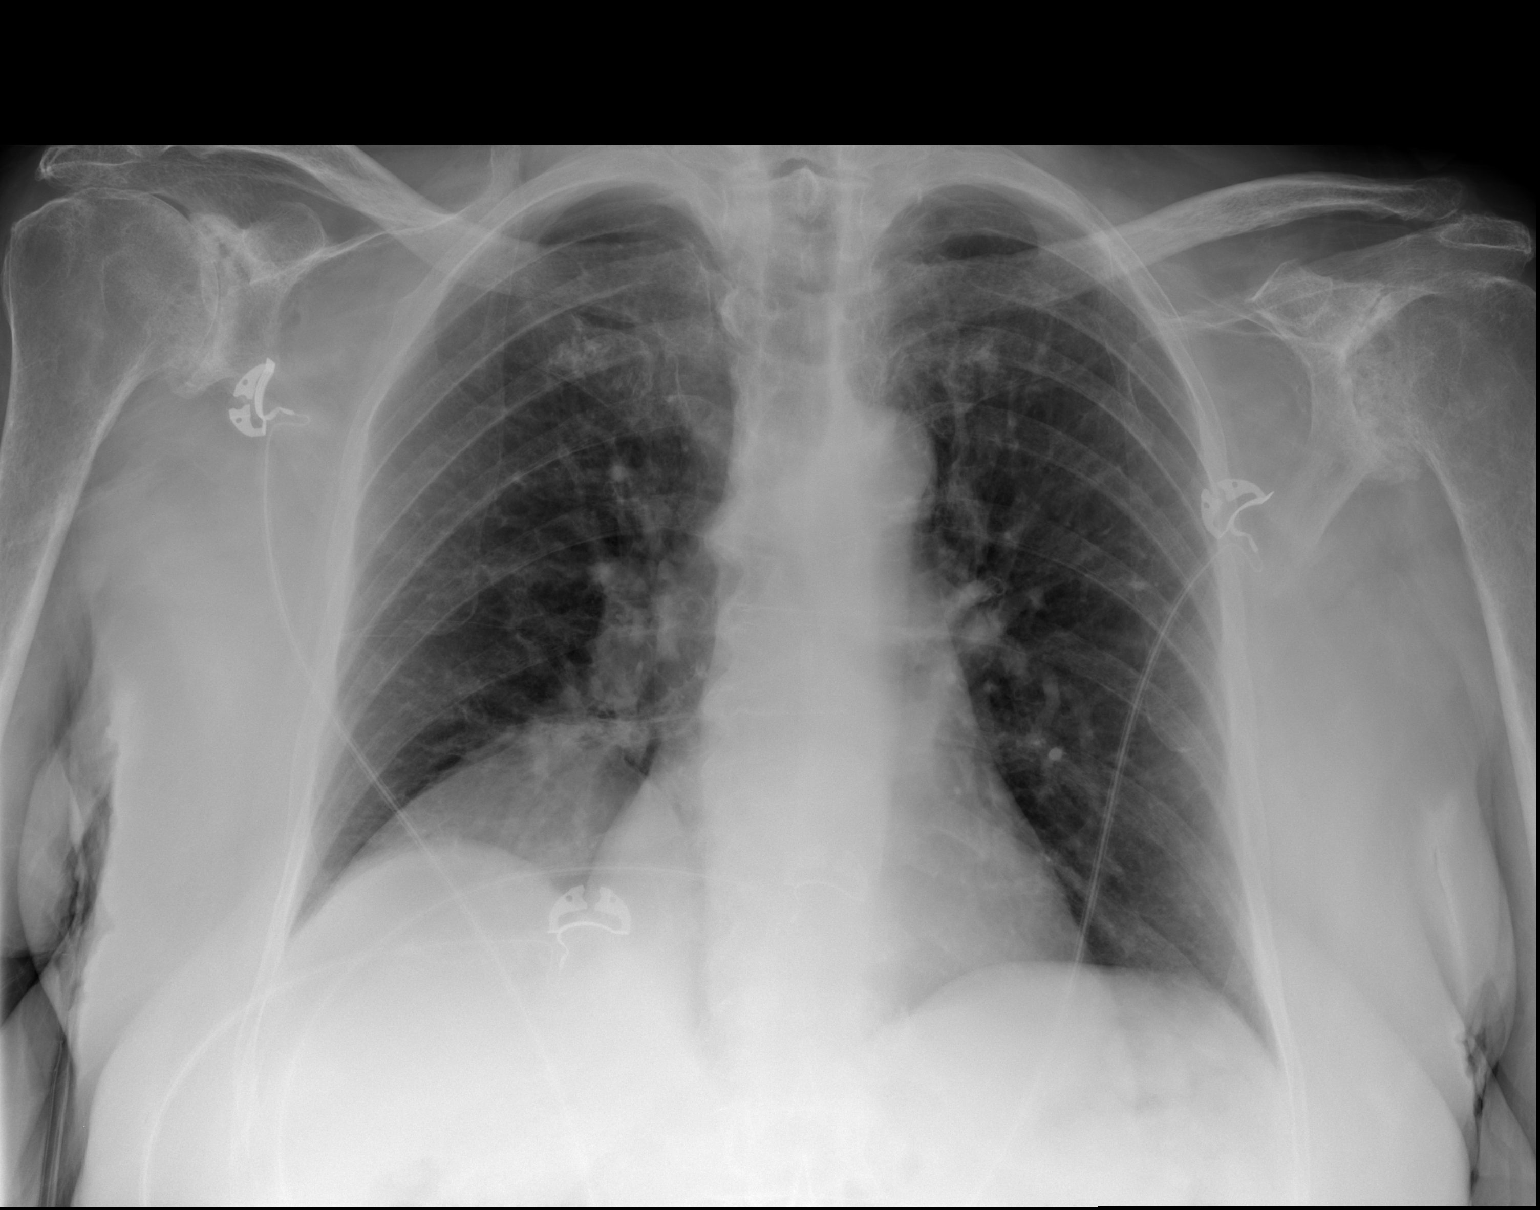

[2 of 2 positions shown; findings below may reference images not displayed]

FINDINGS: Mild cardiomegaly is again identified.
Right basilar scarring is unchanged.
There is no evidence of focal airspace disease, pulmonary edema,
suspicious pulmonary nodule/mass, pleural effusion, or
pneumothorax.
No acute bony abnormalities are identified.
Severe degenerative changes of the shoulders and moderate
degenerative changes within the thoracic spine again noted.
IMPRESSION: Mild cardiomegaly without evidence of acute cardiopulmonary
disease.

Unchanged right basilar scarring.

## 2013-08-22 ENCOUNTER — Other Ambulatory Visit: Payer: Self-pay

## 2013-08-22 MED ORDER — ALLOPURINOL 300 MG PO TABS: 300.0000 mg | ORAL_TABLET | Freq: Every day | ORAL | Status: DC

## 2013-09-09 ENCOUNTER — Telehealth: Payer: Self-pay | Admitting: Internal Medicine

## 2013-09-09 NOTE — Telephone Encounter (Signed)
Called pt to ask her about Knee brace from [facility]. PT DOES NOT WANT THIS. She will talk to Dr. Redmond School when she comes in to see him if she feels like she needs a knee brace.

## 2013-10-18 ENCOUNTER — Encounter: Payer: Self-pay | Admitting: Interventional Cardiology

## 2013-10-30 DIAGNOSIS — E119 Type 2 diabetes mellitus without complications: Secondary | ICD-10-CM | POA: Diagnosis not present

## 2013-10-30 DIAGNOSIS — H26499 Other secondary cataract, unspecified eye: Secondary | ICD-10-CM | POA: Diagnosis not present

## 2013-10-30 DIAGNOSIS — H4011X0 Primary open-angle glaucoma, stage unspecified: Secondary | ICD-10-CM | POA: Diagnosis not present

## 2013-10-30 DIAGNOSIS — H43399 Other vitreous opacities, unspecified eye: Secondary | ICD-10-CM | POA: Diagnosis not present

## 2013-10-31 ENCOUNTER — Ambulatory Visit (INDEPENDENT_AMBULATORY_CARE_PROVIDER_SITE_OTHER): Payer: Medicare Other | Admitting: Family Medicine

## 2013-10-31 ENCOUNTER — Encounter: Payer: Self-pay | Admitting: Family Medicine

## 2013-10-31 VITALS — BP 120/70 | HR 57 | Wt 214.0 lb

## 2013-10-31 DIAGNOSIS — E669 Obesity, unspecified: Secondary | ICD-10-CM | POA: Diagnosis not present

## 2013-10-31 DIAGNOSIS — E785 Hyperlipidemia, unspecified: Secondary | ICD-10-CM

## 2013-10-31 DIAGNOSIS — I10 Essential (primary) hypertension: Secondary | ICD-10-CM

## 2013-10-31 DIAGNOSIS — M069 Rheumatoid arthritis, unspecified: Secondary | ICD-10-CM | POA: Diagnosis not present

## 2013-10-31 DIAGNOSIS — E1169 Type 2 diabetes mellitus with other specified complication: Secondary | ICD-10-CM

## 2013-10-31 DIAGNOSIS — E119 Type 2 diabetes mellitus without complications: Secondary | ICD-10-CM | POA: Diagnosis not present

## 2013-10-31 DIAGNOSIS — E1159 Type 2 diabetes mellitus with other circulatory complications: Secondary | ICD-10-CM

## 2013-10-31 LAB — POCT GLYCOSYLATED HEMOGLOBIN (HGB A1C): HEMOGLOBIN A1C: 5.3

## 2013-10-31 LAB — POCT UA - MICROALBUMIN
ALBUMIN/CREATININE RATIO, URINE, POC: 4.8
CREATININE, POC: 105 mg/dL

## 2013-10-31 NOTE — Progress Notes (Signed)
  Subjective:    Joanne Valdez is a 80 y.o. female who presents for follow-up of Type 2 diabetes mellitus.   She has a relatively sedentary lifestyle. She continues to be followed by rheumatology. She is not taking her prednisone and has not had a refill on the methotrexate recently. Otherwise she seems to be doing fairly well.  Home blood sugar records: 90'S  Current symptoms/problems include NONE Daily foot checks:  Any foot concerns: NONE Last eye exam:  10/30/13 [place] [facility]   Medication compliance: Good Current diet: NONE STAYING  AWAY FROM SUGAR  Current exercise: NONE Known diabetic complications: none Cardiovascular risk factors: advanced age (older than 29 for men, 60 for women), diabetes mellitus, dyslipidemia, obesity (BMI >= 30 kg/m2) and sedentary lifestyle    ROS as in subjective above    Objective:    Wt 214 lb (97.07 kg)  There were no vitals filed for this visit.  General appearence: alert, no distress, WD/WN Hemoglobin A1c is 5.3 Lab Review Lab Results  Component Value Date   HGBA1C 5.7 06/26/2013   Lab Results  Component Value Date   CHOL 140 02/13/2013   HDL 58 02/13/2013   LDLCALC 69 02/13/2013   TRIG 63 02/13/2013   CHOLHDL 2.4 02/13/2013   No results found for this basename: Derl Barrow     Chemistry      Component Value Date/Time   NA 140 02/13/2013 1147   K 4.3 02/13/2013 1147   CL 108 02/13/2013 1147   CO2 24 02/13/2013 1147   BUN 22 02/13/2013 1147   CREATININE 1.05 02/13/2013 1147   CREATININE 1.12* 10/25/2011 0445      Component Value Date/Time   CALCIUM 11.3* 02/13/2013 1147   CALCIUM 12.4* 04/14/2011 1358   ALKPHOS 84 02/13/2013 1147   AST 18 02/13/2013 1147   ALT 10 02/13/2013 1147   BILITOT 1.0 02/13/2013 1147        Chemistry      Component Value Date/Time   NA 140 02/13/2013 1147   K 4.3 02/13/2013 1147   CL 108 02/13/2013 1147   CO2 24 02/13/2013 1147   BUN 22 02/13/2013 1147   CREATININE 1.05  02/13/2013 1147   CREATININE 1.12* 10/25/2011 0445      Component Value Date/Time   CALCIUM 11.3* 02/13/2013 1147   CALCIUM 12.4* 04/14/2011 1358   ALKPHOS 84 02/13/2013 1147   AST 18 02/13/2013 1147   ALT 10 02/13/2013 1147   BILITOT 1.0 02/13/2013 1147          Assessment:   Encounter Diagnoses  Name Primary?  . Rheumatoid arthritis Yes  . Hypertension associated with diabetes   . Obesity (BMI 30-39.9)   . Hyperlipidemia LDL goal <70   . Type 2 diabetes mellitus without complication          Plan:    1.  Rx changes: none 2.  Education: Reviewed 'ABCs' of diabetes management (respective goals in parentheses):  A1C (<7), blood pressure (<130/80), and cholesterol (LDL <100). 3.  Compliance at present is estimated to be good. Efforts to improve compliance (if necessary) will be directed at No intervention. 4. Follow up: 4 months   she will followup with her rheumatologist concerning the methotrexate and prednisone. She is doing quite well on her present regimen I will see her again in 2 months and possibly see her on a twice a year basis after that.

## 2013-11-04 DIAGNOSIS — M069 Rheumatoid arthritis, unspecified: Secondary | ICD-10-CM | POA: Diagnosis not present

## 2013-12-28 ENCOUNTER — Other Ambulatory Visit: Payer: Self-pay | Admitting: Family Medicine

## 2013-12-28 ENCOUNTER — Other Ambulatory Visit: Payer: Self-pay | Admitting: Cardiovascular Disease

## 2013-12-30 NOTE — Telephone Encounter (Signed)
Is this okay to refill? 

## 2014-03-03 ENCOUNTER — Ambulatory Visit: Payer: Medicare Other | Admitting: Family Medicine

## 2014-03-23 ENCOUNTER — Other Ambulatory Visit: Payer: Self-pay | Admitting: Family Medicine

## 2014-04-08 ENCOUNTER — Encounter: Payer: Self-pay | Admitting: Family Medicine

## 2014-04-08 ENCOUNTER — Ambulatory Visit (INDEPENDENT_AMBULATORY_CARE_PROVIDER_SITE_OTHER): Payer: Medicare Other | Admitting: Family Medicine

## 2014-04-08 VITALS — BP 122/80 | HR 70 | Wt 223.0 lb

## 2014-04-08 DIAGNOSIS — M069 Rheumatoid arthritis, unspecified: Secondary | ICD-10-CM

## 2014-04-08 DIAGNOSIS — E1159 Type 2 diabetes mellitus with other circulatory complications: Secondary | ICD-10-CM

## 2014-04-08 DIAGNOSIS — I10 Essential (primary) hypertension: Secondary | ICD-10-CM | POA: Diagnosis not present

## 2014-04-08 DIAGNOSIS — Z23 Encounter for immunization: Secondary | ICD-10-CM

## 2014-04-08 DIAGNOSIS — I152 Hypertension secondary to endocrine disorders: Secondary | ICD-10-CM

## 2014-04-08 DIAGNOSIS — E1169 Type 2 diabetes mellitus with other specified complication: Secondary | ICD-10-CM

## 2014-04-08 DIAGNOSIS — E785 Hyperlipidemia, unspecified: Secondary | ICD-10-CM | POA: Diagnosis not present

## 2014-04-08 DIAGNOSIS — D649 Anemia, unspecified: Secondary | ICD-10-CM | POA: Diagnosis not present

## 2014-04-08 DIAGNOSIS — E669 Obesity, unspecified: Secondary | ICD-10-CM

## 2014-04-08 DIAGNOSIS — E119 Type 2 diabetes mellitus without complications: Secondary | ICD-10-CM

## 2014-04-08 LAB — COMPREHENSIVE METABOLIC PANEL
ALBUMIN: 3.6 g/dL (ref 3.5–5.2)
ALT: 12 U/L (ref 0–35)
AST: 16 U/L (ref 0–37)
Alkaline Phosphatase: 79 U/L (ref 39–117)
BUN: 21 mg/dL (ref 6–23)
CHLORIDE: 106 meq/L (ref 96–112)
CO2: 25 meq/L (ref 19–32)
CREATININE: 0.95 mg/dL (ref 0.50–1.10)
Calcium: 11 mg/dL — ABNORMAL HIGH (ref 8.4–10.5)
Glucose, Bld: 81 mg/dL (ref 70–99)
POTASSIUM: 4.1 meq/L (ref 3.5–5.3)
SODIUM: 138 meq/L (ref 135–145)
TOTAL PROTEIN: 6.4 g/dL (ref 6.0–8.3)
Total Bilirubin: 0.7 mg/dL (ref 0.2–1.2)

## 2014-04-08 LAB — LIPID PANEL
Cholesterol: 143 mg/dL (ref 0–200)
HDL: 51 mg/dL (ref >39)
LDL Cholesterol: 79 mg/dL (ref 0–99)
Total CHOL/HDL Ratio: 2.8 Ratio
Triglycerides: 66 mg/dL (ref <150)
VLDL: 13 mg/dL (ref 0–40)

## 2014-04-08 LAB — CBC WITH DIFFERENTIAL/PLATELET
Basophils Absolute: 0 10*3/uL (ref 0.0–0.1)
Basophils Relative: 0 % (ref 0–1)
EOS ABS: 0.2 10*3/uL (ref 0.0–0.7)
EOS PCT: 3 % (ref 0–5)
HCT: 36.4 % (ref 36.0–46.0)
Hemoglobin: 11.6 g/dL — ABNORMAL LOW (ref 12.0–15.0)
Lymphocytes Relative: 27 % (ref 12–46)
Lymphs Abs: 1.4 10*3/uL (ref 0.7–4.0)
MCH: 31.2 pg (ref 26.0–34.0)
MCHC: 31.9 g/dL (ref 30.0–36.0)
MCV: 97.8 fL (ref 78.0–100.0)
MONOS PCT: 9 % (ref 3–12)
MPV: 10.2 fL (ref 8.6–12.4)
Monocytes Absolute: 0.5 10*3/uL (ref 0.1–1.0)
Neutro Abs: 3.2 10*3/uL (ref 1.7–7.7)
Neutrophils Relative %: 61 % (ref 43–77)
Platelets: 162 10*3/uL (ref 150–400)
RBC: 3.72 MIL/uL — AB (ref 3.87–5.11)
RDW: 15.7 % — AB (ref 11.5–15.5)
WBC: 5.3 10*3/uL (ref 4.0–10.5)

## 2014-04-08 LAB — POCT GLYCOSYLATED HEMOGLOBIN (HGB A1C): HEMOGLOBIN A1C: 5.8

## 2014-04-08 NOTE — Progress Notes (Signed)
Subjective:    Joanne Valdez is a 81 y.o. female who presents for follow-up of Type 2 diabetes mellitus.    Home blood sugar records: patient check B/S one time a day  Current symptoms/problems NONE Daily foot checks:  Any foot concerns: NONE Last eye exam:  08/26/13 Patient goes every 6 mths   Medication compliance: Excellent Current diet: NONE Current exercise: Walking when its warm. Her rheumatoid disease keeps her from being as active as she would like.  Known diabetic complications: none Cardiovascular risk factors: advanced age (older than 24 for men, 62 for women), diabetes mellitus, dyslipidemia, hypertension, obesity (BMI >= 30 kg/m2) and sedentary lifestyle   The following portions of the patient's history were reviewed and updated as appropriate: allergies, current medications, past medical history, past social history and problem list.  ROS as in subjective above    Objective:    General appearence: alert, no distress, WD/WN Neck: supple, no lymphadenopathy, no thyromegaly, no masses Some peripheral edema is noted. Foot exam:  Neuro: foot monofilament exam normal   Lab Review Lab Results  Component Value Date   HGBA1C 5.3 10/31/2013   Lab Results  Component Value Date   CHOL 140 02/13/2013   HDL 58 02/13/2013   LDLCALC 69 02/13/2013   TRIG 63 02/13/2013   CHOLHDL 2.4 02/13/2013   No results found for: Derl Barrow   Chemistry      Component Value Date/Time   NA 140 02/13/2013 1147   K 4.3 02/13/2013 1147   CL 108 02/13/2013 1147   CO2 24 02/13/2013 1147   BUN 22 02/13/2013 1147   CREATININE 1.05 02/13/2013 1147   CREATININE 1.12* 10/25/2011 0445      Component Value Date/Time   CALCIUM 11.3* 02/13/2013 1147   CALCIUM 12.4* 04/14/2011 1358   ALKPHOS 84 02/13/2013 1147   AST 18 02/13/2013 1147   ALT 10 02/13/2013 1147   BILITOT 1.0 02/13/2013 1147        Chemistry      Component Value Date/Time   NA 140 02/13/2013 1147   K 4.3  02/13/2013 1147   CL 108 02/13/2013 1147   CO2 24 02/13/2013 1147   BUN 22 02/13/2013 1147   CREATININE 1.05 02/13/2013 1147   CREATININE 1.12* 10/25/2011 0445      Component Value Date/Time   CALCIUM 11.3* 02/13/2013 1147   CALCIUM 12.4* 04/14/2011 1358   ALKPHOS 84 02/13/2013 1147   AST 18 02/13/2013 1147   ALT 10 02/13/2013 1147   BILITOT 1.0 02/13/2013 1147       Last optometry/ophthalmology exam reviewed from:    Assessment:  Diabetes mellitus without complication - Plan: POCT glycosylated hemoglobin (Hb A1C), HM Diabetes Foot Exam, CBC with Differential, Comprehensive metabolic panel, Lipid panel  Hyperlipidemia LDL goal <70 - Plan: Lipid panel  Hypertension associated with diabetes - Plan: CBC with Differential, Comprehensive metabolic panel, Lipid panel  Obesity (BMI 30-39.9) - Plan: CBC with Differential, Comprehensive metabolic panel, Lipid panel  Need for prophylactic vaccination against Streptococcus pneumoniae (pneumococcus) - Plan: Pneumococcal conjugate vaccine 13-valent  Need for prophylactic vaccination and inoculation against influenza - Plan: Flu vaccine HIGH DOSE PF (Fluzone Tri High dose)  Rheumatoid arthritis  Type 2 diabetes mellitus without complication  Anemia, unspecified anemia type        Plan:    1.  Rx changes: none 2.  Education: Reviewed 'ABCs' of diabetes management (respective goals in parentheses):  A1C (<7), blood pressure (<130/80), and cholesterol (  LDL <100). 3.  Compliance at present is estimated to be good. Efforts to improve compliance (if necessary) will be directed at increased exercise. As tolerated 4. Follow up: 4 months

## 2014-05-20 DIAGNOSIS — E119 Type 2 diabetes mellitus without complications: Secondary | ICD-10-CM | POA: Diagnosis not present

## 2014-05-20 DIAGNOSIS — H4011X3 Primary open-angle glaucoma, severe stage: Secondary | ICD-10-CM | POA: Diagnosis not present

## 2014-05-27 ENCOUNTER — Other Ambulatory Visit: Payer: Self-pay | Admitting: Family Medicine

## 2014-06-21 ENCOUNTER — Other Ambulatory Visit: Payer: Self-pay | Admitting: Family Medicine

## 2014-07-10 DIAGNOSIS — M0609 Rheumatoid arthritis without rheumatoid factor, multiple sites: Secondary | ICD-10-CM | POA: Diagnosis not present

## 2014-09-10 DIAGNOSIS — Z5181 Encounter for therapeutic drug level monitoring: Secondary | ICD-10-CM | POA: Diagnosis not present

## 2014-10-20 ENCOUNTER — Other Ambulatory Visit: Payer: Self-pay

## 2014-10-20 MED ORDER — METOPROLOL SUCCINATE ER 50 MG PO TB24: ORAL_TABLET | ORAL | Status: DC

## 2014-11-24 DIAGNOSIS — H4011X3 Primary open-angle glaucoma, severe stage: Secondary | ICD-10-CM | POA: Diagnosis not present

## 2014-11-24 DIAGNOSIS — E119 Type 2 diabetes mellitus without complications: Secondary | ICD-10-CM | POA: Diagnosis not present

## 2015-01-13 ENCOUNTER — Ambulatory Visit: Payer: Medicare Other | Admitting: Family Medicine

## 2015-01-13 DIAGNOSIS — M0609 Rheumatoid arthritis without rheumatoid factor, multiple sites: Secondary | ICD-10-CM | POA: Diagnosis not present

## 2015-01-13 DIAGNOSIS — M25569 Pain in unspecified knee: Secondary | ICD-10-CM | POA: Diagnosis not present

## 2015-01-13 DIAGNOSIS — M0509 Felty's syndrome, multiple sites: Secondary | ICD-10-CM | POA: Diagnosis not present

## 2015-01-13 DIAGNOSIS — M1990 Unspecified osteoarthritis, unspecified site: Secondary | ICD-10-CM | POA: Diagnosis not present

## 2015-01-13 DIAGNOSIS — Z79899 Other long term (current) drug therapy: Secondary | ICD-10-CM | POA: Diagnosis not present

## 2015-01-19 ENCOUNTER — Ambulatory Visit (INDEPENDENT_AMBULATORY_CARE_PROVIDER_SITE_OTHER): Payer: Medicare Other | Admitting: Family Medicine

## 2015-01-19 ENCOUNTER — Encounter: Payer: Self-pay | Admitting: Family Medicine

## 2015-01-19 VITALS — BP 122/70 | HR 72 | Ht 65.0 in | Wt 221.0 lb

## 2015-01-19 DIAGNOSIS — E785 Hyperlipidemia, unspecified: Secondary | ICD-10-CM | POA: Diagnosis not present

## 2015-01-19 DIAGNOSIS — Z23 Encounter for immunization: Secondary | ICD-10-CM | POA: Diagnosis not present

## 2015-01-19 DIAGNOSIS — E669 Obesity, unspecified: Secondary | ICD-10-CM | POA: Diagnosis not present

## 2015-01-19 DIAGNOSIS — E1159 Type 2 diabetes mellitus with other circulatory complications: Secondary | ICD-10-CM

## 2015-01-19 DIAGNOSIS — M069 Rheumatoid arthritis, unspecified: Secondary | ICD-10-CM

## 2015-01-19 DIAGNOSIS — I10 Essential (primary) hypertension: Secondary | ICD-10-CM

## 2015-01-19 DIAGNOSIS — E119 Type 2 diabetes mellitus without complications: Secondary | ICD-10-CM

## 2015-01-19 LAB — POCT GLYCOSYLATED HEMOGLOBIN (HGB A1C): Hemoglobin A1C: 5.7

## 2015-01-19 NOTE — Progress Notes (Signed)
  Subjective:    Patient ID: Joanne Valdez, female    DOB: 03-10-1927, 81 y.o.   MRN: [id number]  Joanne Valdez is a 81 y.o. female who presents for follow-up of Type 2 diabetes mellitus.  Home blood sugar Patient test one time a day Current symptoms/problems include  none Daily foot checks: yes   Any foot concerns: none Exercise: stays busy  But remains relatively inactive mainly due to her rheumatoid arthritis. It does interfere with her range of motion she does get help with some of her ADLs especially with washing her back. Eyes: 10/2014  she is followed by rheumatology regularly. The following portions of the patient's history were reviewed and updated as appropriate: allergies, current medications, past medical history, past social history and problem list.  ROS as in subjective above.     Objective:    Physical Exam Alert and in no distress otherwise not examined.   Lab Review Diabetic Labs Latest Ref Rng 04/08/2014 10/31/2013 06/26/2013 02/13/2013 07/26/2012  HbA1c - 5.8 5.3 5.7 5.6 5.7  Chol 0 - 200 mg/dL 143 - - 140 -  HDL >39 mg/dL 51 - - 58 -  Calc LDL 0 - 99 mg/dL 79 - - 69 -  Triglycerides <150 mg/dL 66 - - 63 -  Creatinine 0.50 - 1.10 mg/dL 0.95 - - 1.05 -  GFR >60.00 mL/min - - - - -   BP/Weight 04/08/2014 10/31/2013 06/26/2013 16/60/6301 6/0/1093  Systolic BP 235 573 220 254 270  Diastolic BP 80 70 70 72 78  Wt. (Lbs) 223 214 220 225 225  BMI 37.11 35.61 36.61 37.44 37.44   Foot/eye exam completion dates 04/08/2014  Foot Form Completion Done  A1C 5.7  Joanne Valdez  reports that she quit smoking about 33 years ago. She has never used smokeless tobacco. She reports that she does not drink alcohol or use illicit drugs.     Assessment & Plan:    Diabetes mellitus without complication ([place]) - Plan: POCT glycosylated hemoglobin (Hb A1C)  Need for prophylactic vaccination and inoculation against influenza - Plan: Flu vaccine HIGH DOSE PF (Fluzone High dose)  Hyperlipidemia  LDL goal <70  Hypertension associated with diabetes ([place])  Obesity (BMI 30-39.9)  Rheumatoid arthritis involving shoulder, unspecified laterality, unspecified rheumatoid factor presence ([place])    1. Rx changes: none 2. Education: Reviewed 'ABCs' of diabetes management (respective goals in parentheses):  A1C (<7), blood pressure (<130/80), and cholesterol (LDL <100). 3. Compliance at present is estimated to be good. Efforts to improve compliance (if necessary) will be directed at increased exercise. 4. Follow up: 4 months  overall she's does seem to be doing fairly well. I did write a prescription for her to get Zostavax and TDaP at the drugstore. Also get notes sent from rheumatology

## 2015-01-23 ENCOUNTER — Encounter: Payer: Self-pay | Admitting: Family Medicine

## 2015-01-23 NOTE — Patient Outreach (Signed)
[facility]) Care Management  01/23/2015  MAYUKHA SYMMONDS Aug 06, 1926 [id number]   Referral from [employer] Tier 2 List, assigned Jon Billings, RN to outreach for [facility] services.  Thanks, Ronnell Freshwater. [place], [place] Assistant Phone: [phone] Fax: [phone]

## 2015-01-26 ENCOUNTER — Other Ambulatory Visit: Payer: Self-pay

## 2015-01-26 NOTE — Patient Outreach (Signed)
[facility] [facility]) Care Management  01/26/2015  JANAE BONSER [birth year] [id number]   Telephone call for Tier 2 referral.  No answer.  HIPAA compliant voice message left.    Plan: RN Health Coach will attempt patient within 1-2 weeks.    Jone Baseman, RN, MSN [place] [phone]

## 2015-01-27 ENCOUNTER — Other Ambulatory Visit: Payer: Self-pay

## 2015-01-27 DIAGNOSIS — I10 Essential (primary) hypertension: Secondary | ICD-10-CM

## 2015-01-27 NOTE — Patient Outreach (Signed)
[facility]) Care Management  01/27/2015  Joanne Valdez 1926-10-01 [id number]   Telephone call to patient for Tier 2 referral.  Patient admits to HTN, DM, and Arthritis.  Patient reports she currently is not on any diabetes medications presently and last A1c was 5.7. Patient also reports that her blood pressure has been good and last check it was 120/72.  Patient reports her arthritis bothers her knees and she does see a rheumatologist and is currently on methotrexate.  Patient sees her primary doctor on a regular basis.  No recent hospitalizations.    Patient has a granddaughter that lives with her but states she is in school.  Patient does have a aide that comes a couple of days a week to assist with bathing and cleaning.    Transportation: Patient reports she has some issues with transportation to her appointments and errands.  She states her grandchildren used to take her but they are working or in school making it hard for her to get to her appointments.  She recently applied for [employer] transportation. Patient agreeable to social work referral for assist with transportation.   Medications: Patient takes 10 medications daily but reports recently she received a letter from her insurance stating that her lumigan eye drops would not be covered.  Patient expresses some concern about being able to afford them without insurance.  Patient agreeable to pharmacy referral for possible assistance.    Plan: RN Health Coach will do referral for social work and pharmacy.   Jone Baseman, RN, MSN [facility] [phone]

## 2015-01-27 NOTE — Patient Outreach (Addendum)
[facility]) Care Management  01/27/2015  Joanne Valdez April 28, 1926 [id number]   Request from Jon Billings, RN to assign Pharmacy and SW, assigned Joanne Valdez, PharmD and Joanne Valdez, [place].  Thanks, Ronnell Freshwater. [place], [place] Assistant Phone: [phone] Fax: [phone]

## 2015-01-29 ENCOUNTER — Other Ambulatory Visit: Payer: Self-pay | Admitting: Licensed Clinical Social Worker

## 2015-01-29 NOTE — Patient Outreach (Signed)
[place] [facility]) Care Management  01/29/2015  Joanne Valdez [birth year] [id number]   Assessment-CSW completed outreach to patient on 01/29/15 after receiving referral for transportation assistance. Patient provided HIPPA verifications. CSW introduced self, reason for care and of [place]. Patient agreeable to services. Patient reports that she was about to walk out of her door but can talk on the phone for about 15 minutes before she has to leave. Patient reports having issues with affording her medications. Patient aware that pharmacy should be contacting her within the next week. Patient reports she use to use [employer]AT transportation services but then stopped using services as her granddaughter and grandson who both live with her were able to transport her due to their work hours. Patient did not get SCAT services re-certified during that time as she had stable transportation. Patient shared that their work hours changed and one of her grandchildren is now in school and she does not have stable transportation at this time. Patient reports that she has already completed the entire SCAT application with the assistance of her PCP and then she mailed it in last week. CSW will contact SCAT office to check on status of SCAT application. CSW educated patient on [employer] and she was agreeable to a referral. CSW explained their application process and their policies on arranging transportation. Patient denies having an advance directive but is agreeable to receiving information on it. Patient shares that she is unsure is she wishes to complete advance directive but open to education. Patient denies any feelings or symptoms of depression. CSW completed some of the assessment before patient had to get off the phone. Patient agreeable to CSW mailing out community resources and advance directive. CSW will follow up with patient next week.  Plan-CSW completed referral to [employer]  on 01/29/15. CSW contacted SCAT office and left a HIPPA compliant voice message to Floydene Flock to inquire SCAT application. CSW will message Damita Rhodie through in basket in order for community resources to mailed out to patient's residence.   Eula Fried, BSW, MSW, [email or web address] Phone: [phone] Fax: [phone]

## 2015-02-02 ENCOUNTER — Other Ambulatory Visit: Payer: Self-pay | Admitting: Pharmacist

## 2015-02-02 NOTE — Patient Outreach (Signed)
Joanne Valdez is a 81 y.o. female referred to pharmacy for medication assistance with her Lumigan eye drops. Patient reports that she received a letter in the mail from her insurance company, [employer] [employer] [facility] [employer], with [employer] stating that these eye drops would no longer be covered.  Call and speak with Randall Hiss at [facility] [phone]) who reports that latanoprost, generic of Xalatan, in the same class of medication will be covered and will be a tier 1 copay for the patient.  Let [name] know that this alternative will be covered. Patient reports that at this time she cannot think of the name of her eye doctor, but that she will call him tomorrow and let him know about this substitution. Spell out the name of the medication and it's brand name for [name]. Offer to call Ms. Moster back on Friday to confirm that she was able to speak with her physician and that she does not have any further questions. Also provide the patient with my phone number.  PLAN:  1) Patient to reach out to her eye doctor's office tomorrow about the substitution of Lumigan to latanoprost for insurance coverage.  2) Will call to follow up with [name] on Friday, 02/06/15.  Harlow Asa, PharmD Clinical Pharmacist [place] Management [phone]

## 2015-02-06 ENCOUNTER — Other Ambulatory Visit: Payer: Self-pay | Admitting: Pharmacist

## 2015-02-06 NOTE — Patient Outreach (Signed)
Called to follow up with Ms. Joanne Valdez about her call to her eye doctor. Ms. Joanne Valdez states that she has not had a chance to call her eye doctor yet and cannot remember her eye doctor's name, but states that she goes to [facility] on [address] in [place]. Let Ms. Joanne Valdez know that I will call to follow up with [facility].  Harlow Asa, PharmD Clinical Pharmacist [place] Management [phone]

## 2015-02-06 NOTE — Patient Outreach (Signed)
[facility] on [address] in [place] [phone]) and spoke with Gatha Mayer. Gatha Mayer states that the patient's eye doctor is Dr. Manuella Ghazi. Leave a message with Gatha Mayer for Dr. Manuella Ghazi that the patient's Lumigan eye drops will no longer be covered by the patient's [facility] in 2017. However, per her insurance, latanoprost, generic of Xalatan, in the same class of medication will be covered and will be a tier 1 copay for the patient.  Gatha Mayer states that the office will give me a call back once the message has been read by Dr. Manuella Ghazi. If have not heard back by next week, will follow up again at that time.  Harlow Asa, PharmD Clinical Pharmacist [place] Management [phone]

## 2015-02-09 ENCOUNTER — Other Ambulatory Visit: Payer: Self-pay | Admitting: Licensed Clinical Social Worker

## 2015-02-09 NOTE — Patient Outreach (Signed)
[place] [facility]) Care Management  02/09/2015  Joanne Valdez October 17, 1926 [id number]   Assessment-CSW completed outreach to patient to gather updates on transportation assistance. Patient answered and provided HIPPA verifications. Patient reports that she received the [employer] application and completed form and has already mailed it back in. CSW explained that she is now eligible to use their transportation services. CSW questioned if she had [employer]'s contact information in a visible place where she can remember to contact them a week in advance to arrange transportation. Patient denied having number and CSW provided contact information. CSW questioned if she received call from SCAT and patient denied. CSW received return call from Joanne Valdez before who stated that there were two different routes to mail application and one usually takes a lot longer. CSW provided patient with this update. Patient reports that she is unsure which address she used but "I know I mailed it and that it should be there by now." Patient questioned if CSW could contact SCAT again to inquire about SCAT application and CSW agreed.  CSW contacted SCAT office and it was informed that Joanne Valdez was not there today but I could leave a message. CSW left a HIPPA compliant voice message.  Plan-CSW will await for return call from SCAT so that CSW can gain information and update patient.  Eula Fried, BSW, MSW, [email or web address] Phone: [phone] Fax: [phone]

## 2015-02-13 ENCOUNTER — Other Ambulatory Visit: Payer: Self-pay | Admitting: Licensed Clinical Social Worker

## 2015-02-13 ENCOUNTER — Other Ambulatory Visit: Payer: Self-pay | Admitting: Pharmacist

## 2015-02-13 NOTE — Patient Outreach (Signed)
[facility]) Care Management  02/13/2015  Joanne Valdez [birth year] [id number]   Assessment-CSW completed third outreach to SCAT admission Joanne Valdez and left a second HIPPA compliant voice message. CSW received call back from Joanne Valdez. Joanne Valdez states that she will be calling patient to set up an assessment appointment. Transportation will be arranged for patient to this appointment as well. CSW contacted patient's number and left a HIPPA compliant voice message urging return call.  Plan-CSW will make an additional outreach by 02/18/15.  Eula Fried, BSW, MSW, [email or web address] Phone: [phone] Fax: [phone]

## 2015-02-13 NOTE — Patient Outreach (Signed)
Called again to follow up with [facility] on [address] in [place] [phone]) about the patient's Lumigan eye drops no longer being covered by the patient's [facility] in 2017. However, per her insurance, latanoprost, generic of Xalatan, in the same class of medication will be covered and will be a tier 1 copay for the patient.  Left message on the voicemail of the technician for Dr. Manuella Ghazi. If have not heard back from Dr. Trena Platt office by next week, will follow up with the patient.  Harlow Asa, PharmD Clinical Pharmacist [place] Management [phone]

## 2015-02-13 NOTE — Patient Outreach (Signed)
[place] [facility]) Care Management  02/13/2015  VELENCIA HO 1927-01-30 [id number]   Assessment-CSW received call back from patient after CSW left voice message. CSW provided patient with updated information on SCAT. Patient reports that she will look forward to hearing back from SCAT and setting up an assessment appointment.  Plan-CSW will follow up on transportation in two weeks.  Eula Fried, BSW, MSW, [email or web address] Phone: [phone] Fax: [phone]

## 2015-02-13 NOTE — Patient Outreach (Signed)
Received a call back from [facility] [place] [facility], technician with Dr. Manuella Ghazi at [facility]. Joanne Valdez acknowledges that the patient's Lumigan eye drops no longer being covered by the patient's [facility] in 2017 and that per her insurance, latanoprost, generic of Xalatan, in the same class of medication will be covered and will be a tier 1 copay for the patient.  Joanne Valdez, PharmD Clinical Pharmacist [place] Management [phone]

## 2015-02-25 ENCOUNTER — Other Ambulatory Visit: Payer: Self-pay | Admitting: Licensed Clinical Social Worker

## 2015-02-25 DIAGNOSIS — H401133 Primary open-angle glaucoma, bilateral, severe stage: Secondary | ICD-10-CM | POA: Diagnosis not present

## 2015-02-25 NOTE — Patient Outreach (Addendum)
[place] [facility]) Care Management  02/25/2015  Joanne Valdez May 03, 1926 [id number]   Assessment-CSW completed outreach to patient on 02/25/15 and was able to reach her. Patient confirms that SCAT has contacted her and that her assessment appointment for 03/09/15 at 10 am. Patient reports that she is waiting for SCAT to contact her back to confirm transportation to and from the assessment appointment. Patient reports that she has been "doing really good." Patient shares that she has been struggling some with her arthritis but overall has been doing well. Patient reports that she picked up her eye drops from her pharmacy but that she has been unable to open bottle and does not have anyone around to assist her. However, patient has an appointment today with her eye doctor, Dr. Brigitte Pulse and will be able to ask him to assist her in learning how to use eye drop bottle.   Plan-CSW will follow up on SCAT referral by 03/11/15. CSW will mail out community resource guide to patient.  Eula Fried, BSW, MSW, [email or web address] Phone: [phone] Fax: [phone]

## 2015-02-26 NOTE — Patient Outreach (Signed)
[name]) Care Management  02/26/2015  Joanne Valdez 08-16-1926 [id number]   Request received from Eula Fried, LCSW to mail patient community resource guide for seniors. Packet mailed 02/26/15.   [place] Management Assistant

## 2015-03-10 ENCOUNTER — Other Ambulatory Visit: Payer: Self-pay | Admitting: Licensed Clinical Social Worker

## 2015-03-10 NOTE — Patient Outreach (Addendum)
[facility]) Care Management  03/10/2015  Joanne Valdez Feb 03, 1927 [id number]   Assessment-CSW completed outreach to patient on 03/10/15 to follow up on SCAT assessment appointment yesterday. Patient answered and provided verifications. Patient shares that she completed SCAT assessment and was approved for services. Patient will start using services tomorrow. Patient shares that she received community resource guide for seniors and CSW reviewed entire document packet with patient over the phone. Patient understands that she also can use services with [employer] for medical appointments as needed. CSW educated patient on how to schedule transportation arrangements with both services. Patient is agreeable to social work discharge as all goals have been met. CSW encouraged patient to contact CSW if she had any future questions or concerns.  Plan-Patient now will receive transportation services with both [employer] and [employer]. Patient has an entire packet of community resources for seniors in [facility] that she can use if needed. CSW will inform [place] Pharmacist of social work discharge.   Eula Fried, BSW, MSW, [email or web address] Phone: [phone] Fax: [phone]

## 2015-03-11 ENCOUNTER — Other Ambulatory Visit: Payer: Self-pay | Admitting: Family Medicine

## 2015-05-29 ENCOUNTER — Other Ambulatory Visit: Payer: Self-pay | Admitting: Family Medicine

## 2015-06-04 ENCOUNTER — Ambulatory Visit (INDEPENDENT_AMBULATORY_CARE_PROVIDER_SITE_OTHER): Payer: Medicare Other | Admitting: Family Medicine

## 2015-06-04 ENCOUNTER — Encounter: Payer: Self-pay | Admitting: Family Medicine

## 2015-06-04 VITALS — BP 132/80 | HR 64 | Temp 97.8°F | Wt 231.4 lb

## 2015-06-04 DIAGNOSIS — R0981 Nasal congestion: Secondary | ICD-10-CM

## 2015-06-04 NOTE — Patient Instructions (Signed)
Use AYR to help with the nose

## 2015-06-04 NOTE — Progress Notes (Signed)
   Subjective:    Patient ID: Joanne Valdez, female    DOB: Jul 23, 1926, 82 y.o.   MRN: [id number]  HPI She is here for evaluation of nasal congestion and dryness in the nostrils but no PND, rhinorrhea, fever, chills. He was difficult to get a good history from her.   Review of Systems     Objective:   Physical Exam Alert and in no distress. Nasal mucosa appears normalTympanic membranes and canals are normal;the right canal was lavaged of a small amount of cerumen. Pharyngeal area is normal. Neck is supple without adenopathy or thyromegaly. Cardiac exam shows a regular sinus rhythm without murmurs or gallops. Lungs are clear to auscultation.        Assessment & Plan:  Nasal sinus congestion her exam was essentially normal. Did recommend she try topical nasal preparation called AYR.

## 2015-06-29 ENCOUNTER — Ambulatory Visit (INDEPENDENT_AMBULATORY_CARE_PROVIDER_SITE_OTHER): Payer: Medicare Other | Admitting: Family Medicine

## 2015-06-29 ENCOUNTER — Encounter: Payer: Self-pay | Admitting: Family Medicine

## 2015-06-29 VITALS — BP 130/78 | HR 68 | Wt 229.8 lb

## 2015-06-29 DIAGNOSIS — L309 Dermatitis, unspecified: Secondary | ICD-10-CM | POA: Diagnosis not present

## 2015-06-29 NOTE — Progress Notes (Signed)
   Subjective:    Patient ID: Joanne Valdez, female    DOB: [birth year], 82 y.o.   MRN: [id number]  HPI She complains of drainage mainly from the left ear but no pain, purulent discharge, follow odor.   Review of Systems     Objective:   Physical Exam Recumbent in no distress. The canal meatus is slightly dry and scaly but no discharge is noted on the left. Canal and TM is normal bilaterally.       Assessment & Plan:  Dermatitis Commend cortisone cream. Reassured her that this is not any danger to her.

## 2015-07-14 DIAGNOSIS — H6123 Impacted cerumen, bilateral: Secondary | ICD-10-CM | POA: Diagnosis not present

## 2015-07-14 DIAGNOSIS — J310 Chronic rhinitis: Secondary | ICD-10-CM | POA: Diagnosis not present

## 2015-07-17 DIAGNOSIS — M0609 Rheumatoid arthritis without rheumatoid factor, multiple sites: Secondary | ICD-10-CM | POA: Diagnosis not present

## 2015-07-17 DIAGNOSIS — M1990 Unspecified osteoarthritis, unspecified site: Secondary | ICD-10-CM | POA: Diagnosis not present

## 2015-07-17 DIAGNOSIS — M25569 Pain in unspecified knee: Secondary | ICD-10-CM | POA: Diagnosis not present

## 2015-07-17 DIAGNOSIS — Z79899 Other long term (current) drug therapy: Secondary | ICD-10-CM | POA: Diagnosis not present

## 2015-07-20 ENCOUNTER — Ambulatory Visit: Payer: Medicare Other | Admitting: Family Medicine

## 2015-07-23 ENCOUNTER — Emergency Department (HOSPITAL_COMMUNITY)
Admission: EM | Admit: 2015-07-23 | Discharge: 2015-07-23 | Disposition: A | Discharge status: Home / Self Care | Payer: Medicare Other | Attending: Emergency Medicine | Admitting: Emergency Medicine

## 2015-07-23 ENCOUNTER — Telehealth: Payer: Self-pay | Admitting: Family Medicine

## 2015-07-23 ENCOUNTER — Encounter (HOSPITAL_COMMUNITY): Payer: Self-pay | Admitting: Emergency Medicine

## 2015-07-23 DIAGNOSIS — N898 Other specified noninflammatory disorders of vagina: Secondary | ICD-10-CM | POA: Diagnosis not present

## 2015-07-23 DIAGNOSIS — E119 Type 2 diabetes mellitus without complications: Secondary | ICD-10-CM | POA: Diagnosis not present

## 2015-07-23 DIAGNOSIS — I10 Essential (primary) hypertension: Secondary | ICD-10-CM | POA: Insufficient documentation

## 2015-07-23 DIAGNOSIS — E669 Obesity, unspecified: Secondary | ICD-10-CM | POA: Insufficient documentation

## 2015-07-23 LAB — BASIC METABOLIC PANEL
Anion gap: 7 (ref 5–15)
BUN: 15 mg/dL (ref 6–20)
CALCIUM: 11.8 mg/dL — AB (ref 8.9–10.3)
CO2: 26 mmol/L (ref 22–32)
CREATININE: 1.05 mg/dL — AB (ref 0.44–1.00)
Chloride: 108 mmol/L (ref 101–111)
GFR calc non Af Amer: 47 mL/min — ABNORMAL LOW (ref >60)
GFR, EST AFRICAN AMERICAN: 54 mL/min — AB (ref >60)
Glucose, Bld: 88 mg/dL (ref 65–99)
Potassium: 3.8 mmol/L (ref 3.5–5.1)
SODIUM: 141 mmol/L (ref 135–145)

## 2015-07-23 LAB — CBC
HCT: 36.4 % (ref 36.0–46.0)
Hemoglobin: 11.4 g/dL — ABNORMAL LOW (ref 12.0–15.0)
MCH: 30 pg (ref 26.0–34.0)
MCHC: 31.3 g/dL (ref 30.0–36.0)
MCV: 95.8 fL (ref 78.0–100.0)
Platelets: 139 10*3/uL — ABNORMAL LOW (ref 150–400)
RBC: 3.8 MIL/uL — ABNORMAL LOW (ref 3.87–5.11)
RDW: 16 % — AB (ref 11.5–15.5)
WBC: 4.6 10*3/uL (ref 4.0–10.5)

## 2015-07-23 NOTE — ED Notes (Signed)
Pt reports vaginal discharge x 1 day which she describes as "gummy". Pt also reports that she is concerned her one kidney is not working right. Pt alert x4. NAD at this time. Pt denies pain.

## 2015-07-23 NOTE — ED Notes (Signed)
Pt lwbs 

## 2015-07-23 NOTE — Telephone Encounter (Signed)
A man called on behalf of pt, he said they were at the emergency dept. ( I couldn't clearly understand most of his conversation).  He states she is having trouble urinating and if we could just give her some lasix since she only has one kidney.  I asked him if she had a urinary tract infection and he said no, I asked how he determined that, he said she couldn't go to the bathroom.  I asked him why are they at the ER and he said because she cannot go to the bathroom.  I explained he needed to let them check her and her urine to see if she has an infection.  That we cannot just send out a medication without seeing the patient.  He said ok.

## 2015-07-27 ENCOUNTER — Encounter: Payer: Self-pay | Admitting: Family Medicine

## 2015-07-27 ENCOUNTER — Ambulatory Visit (INDEPENDENT_AMBULATORY_CARE_PROVIDER_SITE_OTHER): Payer: Medicare Other | Admitting: Family Medicine

## 2015-07-27 VITALS — BP 126/70 | HR 85 | Ht 65.0 in | Wt 233.0 lb

## 2015-07-27 DIAGNOSIS — R609 Edema, unspecified: Secondary | ICD-10-CM

## 2015-07-27 DIAGNOSIS — I10 Essential (primary) hypertension: Secondary | ICD-10-CM

## 2015-07-27 DIAGNOSIS — E785 Hyperlipidemia, unspecified: Secondary | ICD-10-CM

## 2015-07-27 DIAGNOSIS — E118 Type 2 diabetes mellitus with unspecified complications: Secondary | ICD-10-CM

## 2015-07-27 DIAGNOSIS — E669 Obesity, unspecified: Secondary | ICD-10-CM

## 2015-07-27 DIAGNOSIS — E1159 Type 2 diabetes mellitus with other circulatory complications: Secondary | ICD-10-CM

## 2015-07-27 DIAGNOSIS — M069 Rheumatoid arthritis, unspecified: Secondary | ICD-10-CM | POA: Diagnosis not present

## 2015-07-27 LAB — POCT GLYCOSYLATED HEMOGLOBIN (HGB A1C): Hemoglobin A1C: 5.9

## 2015-07-27 NOTE — Progress Notes (Signed)
   Subjective:    Patient ID: Joanne Valdez, female    DOB: 1927/03/28, 82 y.o.   MRN: [id number]  HPI She is here for a diabetes recheck. Her physical activity is quite limited due to her rheumatoid arthritis, age and weight. She does not check her blood sugars regularly.Difficult for her to check her feet. Unclear when she had her last eye exam. Her main complaint today is swelling in her feet that is intermittent. She has had difficulty with this over the last several years but no chest pain, PND or shortness of breath. She continues to be followed by her rheumatologist. Smoking and drinking are not an issue. Medications were reviewed. Family and social history as well as health maintenance is unchanged.   Review of Systems     Objective:   Physical Exam Alert and in no distress. Cardiac exam shows regular rhythm without murmurs or gallops. Lungs are clear to auscultation. Bilateral lower extremity exam does show 2+ pitting edema with negative Homans sign. Hemoglobin A1c is 5.9      Assessment & Plan:  Type 2 diabetes mellitus with complication, without long-term current use of insulin (HCC)  Hyperlipidemia LDL goal <70  Hypertension associated with diabetes ([place])  Obesity (BMI 30-39.9)  Rheumatoid arthritis involving knee, unspecified laterality, unspecified rheumatoid factor presence (HCC)  Dependent edema Continue on present medication regimen. Recommend she elevate her feet as much as possible and also wear support stockings.

## 2015-07-27 NOTE — Patient Instructions (Signed)
Elevate her feet when you're sitting and in the morning when you get out of bed put on some good support stockings

## 2015-08-12 ENCOUNTER — Other Ambulatory Visit: Payer: Self-pay | Admitting: Cardiovascular Disease

## 2015-08-12 NOTE — Telephone Encounter (Signed)
Please advise. Pt is seen on as needed basis. LOV in 2014.

## 2015-08-13 NOTE — Telephone Encounter (Signed)
Dr. Redmond School- We have not seen this patient since 2014, and she is to see [place] PRN. Since patient has recently been seen by you 07/27/15, will you refill this medication?

## 2015-09-30 ENCOUNTER — Ambulatory Visit (INDEPENDENT_AMBULATORY_CARE_PROVIDER_SITE_OTHER): Payer: Medicare Other | Admitting: Family Medicine

## 2015-09-30 ENCOUNTER — Telehealth: Payer: Self-pay

## 2015-09-30 ENCOUNTER — Encounter: Payer: Self-pay | Admitting: Family Medicine

## 2015-09-30 VITALS — BP 122/72 | HR 96 | Ht 65.0 in | Wt 211.0 lb

## 2015-09-30 DIAGNOSIS — R627 Adult failure to thrive: Secondary | ICD-10-CM | POA: Diagnosis not present

## 2015-09-30 DIAGNOSIS — E669 Obesity, unspecified: Secondary | ICD-10-CM | POA: Diagnosis not present

## 2015-09-30 DIAGNOSIS — M069 Rheumatoid arthritis, unspecified: Secondary | ICD-10-CM

## 2015-09-30 DIAGNOSIS — E118 Type 2 diabetes mellitus with unspecified complications: Secondary | ICD-10-CM

## 2015-09-30 DIAGNOSIS — E785 Hyperlipidemia, unspecified: Secondary | ICD-10-CM | POA: Diagnosis not present

## 2015-09-30 MED ORDER — METOPROLOL SUCCINATE ER 50 MG PO TB24: ORAL_TABLET | ORAL | Status: DC

## 2015-09-30 NOTE — Progress Notes (Signed)
   Subjective:    Patient ID: Joanne Valdez, female    DOB: Oct 02, 1926, 82 y.o.   MRN: [id number]  HPI She is here with her daughter for consultation. Her daughter plans to move her to [place] eventually. Today I was informed that she has had home health coming for the last 2 years which has helped with bathing, dressing, eating as well as right housework. She does have underlying diabetes, hyper lipidemia, hypertension, obesity as well as rheumatoid arthritis. She has been followed here for that and has done relatively well with this however she has never made any comments about needing help around the house. Apparently they do plan to sell the house and move back with her daughter to [place]. Her daughter would like an FMLA form for the next year to help with transition.  Review of Systems     Objective:   Physical Exam Alert and in no distress otherwise not examined       Assessment & Plan:  Failure to thrive in adult  Type 2 diabetes mellitus with complication, without long-term current use of insulin (HCC)  Hyperlipidemia LDL goal <70  Obesity (BMI 30-39.9)  Rheumatoid arthritis involving knee, unspecified laterality, unspecified rheumatoid factor presence ([place]) An FMLA form was filled out for the next year. We discussed her needs when she moves to [place] in regard to possibly even placing her in assisted living. Also encouraged her to get an eldercare wire to help with all the legal implications of this. metoprolol renewed

## 2015-09-30 NOTE — Telephone Encounter (Signed)
Okay 

## 2015-09-30 NOTE — Telephone Encounter (Signed)
Patient forgot to ask if she should go back on metatoprol or not please advise

## 2015-10-01 NOTE — Telephone Encounter (Signed)
Left message to call me back.

## 2015-10-01 NOTE — Telephone Encounter (Signed)
Is this yes or no

## 2015-10-01 NOTE — Telephone Encounter (Signed)
I called it in 

## 2015-10-01 NOTE — Telephone Encounter (Signed)
Pt informed and verbalized understanding

## 2015-11-10 ENCOUNTER — Other Ambulatory Visit: Payer: Self-pay | Admitting: Family Medicine

## 2016-01-28 ENCOUNTER — Ambulatory Visit: Payer: Medicare Other | Admitting: Family Medicine

## 2016-02-17 ENCOUNTER — Ambulatory Visit (INDEPENDENT_AMBULATORY_CARE_PROVIDER_SITE_OTHER): Payer: Medicare Other | Admitting: Family Medicine

## 2016-02-17 ENCOUNTER — Encounter: Payer: Self-pay | Admitting: Family Medicine

## 2016-02-17 VITALS — BP 122/80 | HR 77 | Ht 65.0 in | Wt 210.0 lb

## 2016-02-17 DIAGNOSIS — E785 Hyperlipidemia, unspecified: Secondary | ICD-10-CM | POA: Diagnosis not present

## 2016-02-17 DIAGNOSIS — M069 Rheumatoid arthritis, unspecified: Secondary | ICD-10-CM

## 2016-02-17 DIAGNOSIS — E1159 Type 2 diabetes mellitus with other circulatory complications: Secondary | ICD-10-CM

## 2016-02-17 DIAGNOSIS — E118 Type 2 diabetes mellitus with unspecified complications: Secondary | ICD-10-CM | POA: Diagnosis not present

## 2016-02-17 DIAGNOSIS — Z23 Encounter for immunization: Secondary | ICD-10-CM

## 2016-02-17 DIAGNOSIS — I10 Essential (primary) hypertension: Secondary | ICD-10-CM | POA: Diagnosis not present

## 2016-02-17 DIAGNOSIS — E669 Obesity, unspecified: Secondary | ICD-10-CM

## 2016-02-17 LAB — POCT GLYCOSYLATED HEMOGLOBIN (HGB A1C): HEMOGLOBIN A1C: 5.5

## 2016-02-17 NOTE — Progress Notes (Signed)
  Subjective:    Patient ID: Joanne Valdez, female    DOB: Sep 21, 1926, 82 y.o.   MRN: [id number]  Joanne Valdez is a 82 y.o. female who presents for follow-up of Type 2 diabetes mellitus.  Patient is not checking home blood sugars.   Home blood sugar records: na How often is blood sugars being checked: na Current symptoms/problems none Daily foot checks: yes   Any foot concerns: stay cold Last eye exam: 8 months ago Exercise: none  She continues on methotrexate and prednisone 5 mg. She has not seen her rheumatologist in roughly 6 months. She is now living with her daughter in [place]. She does have an appointment set up to see a new physician in [place] within the next several weeks and apparently does have enough medicine to last until then. She continues on amlodipine, atorvastatin, folate acid, iron, metoprolol. She does state that her legs are doing much better and less swollen. She offers no other concerns or complaints. The following portions of the patient's history were reviewed and updated as appropriate: allergies, current medications, past medical history, past social history and problem list.  ROS as in subjective above.     Objective:    Physical Exam Alert and in no distress Her feet show much less edema   Lab Review Diabetic Labs Latest Ref Rng & Units 07/27/2015 07/23/2015 01/19/2015 04/08/2014 10/31/2013  HbA1c - 5.9 - 5.7 5.8 5.3  Microalbumin mg/L - - - - <5.0  Micro/Creat Ratio - - - - - 4.8  Chol 0 - 200 mg/dL - - - 143 -  HDL >39 mg/dL - - - 51 -  Calc LDL 0 - 99 mg/dL - - - 79 -  Triglycerides <150 mg/dL - - - 66 -  Creatinine 0.44 - 1.00 mg/dL - 1.05(H) - 0.95 -  GFR >60.00 mL/min - - - - -   BP/Weight 02/17/2016 09/30/2015 07/27/2015 [id number]  Systolic BP 123XX123 123XX123 123XX123 [id number]  Diastolic BP 80 72 70 60 78  Wt. (Lbs) 210 211 233 - 229.8  BMI 34.95 35.11 38.77 - 38.24   Foot/eye exam completion dates 04/08/2014  Foot Form Completion Done     Joanne Valdez  reports that she quit smoking about 34 years ago. She has never used smokeless tobacco. She reports that she does not drink alcohol or use drugs.     Assessment & Plan:    Need for prophylactic vaccination and inoculation against influenza  Type 2 diabetes mellitus with complication, without long-term current use of insulin ([place])  Hyperlipidemia LDL goal <70  Obesity (BMI 30-39.9)  Rheumatoid arthritis involving knee, unspecified laterality, unspecified rheumatoid factor presence ([place])  Hypertension associated with diabetes ([place])   1. Rx changes: none 2. Education: Reviewed 'ABCs' of diabetes management (respective goals in parentheses):  A1C (<7), blood pressure (<130/80), and cholesterol (LDL <100). 3. Compliance at present is estimated to be good. Efforts to improve compliance (if necessary) will be directed at No change. Follow up: With new physician in [place] She will need a shingles vaccine as well as tetanus and renewal of several of her medications.

## 2016-02-23 ENCOUNTER — Other Ambulatory Visit: Payer: Self-pay

## 2016-02-23 DIAGNOSIS — Z79899 Other long term (current) drug therapy: Secondary | ICD-10-CM | POA: Diagnosis not present

## 2016-02-23 DIAGNOSIS — E785 Hyperlipidemia, unspecified: Secondary | ICD-10-CM | POA: Diagnosis not present

## 2016-02-23 DIAGNOSIS — H409 Unspecified glaucoma: Secondary | ICD-10-CM | POA: Diagnosis not present

## 2016-02-23 DIAGNOSIS — M069 Rheumatoid arthritis, unspecified: Secondary | ICD-10-CM | POA: Diagnosis not present

## 2016-02-23 DIAGNOSIS — I10 Essential (primary) hypertension: Secondary | ICD-10-CM | POA: Diagnosis not present

## 2016-02-23 DIAGNOSIS — Z1389 Encounter for screening for other disorder: Secondary | ICD-10-CM | POA: Diagnosis not present

## 2016-02-23 MED ORDER — ALLOPURINOL 300 MG PO TABS: 300.0000 mg | ORAL_TABLET | Freq: Every day | ORAL | 11 refills | Status: AC

## 2016-02-23 MED ORDER — ATORVASTATIN CALCIUM 40 MG PO TABS: ORAL_TABLET | ORAL | 11 refills | Status: AC

## 2016-02-23 MED ORDER — METOPROLOL SUCCINATE ER 50 MG PO TB24: ORAL_TABLET | ORAL | 3 refills | Status: AC

## 2016-02-23 MED ORDER — AMLODIPINE BESYLATE 10 MG PO TABS: 10.0000 mg | ORAL_TABLET | Freq: Every day | ORAL | 11 refills | Status: AC

## 2016-02-25 ENCOUNTER — Telehealth: Payer: Self-pay

## 2016-02-25 NOTE — Telephone Encounter (Signed)
Records faxed to [facility] in [place]. Pt has moved to [place] with her daughter and needs to transfer care to new PCP. Joanne Valdez

## 2017-08-02 DIAGNOSIS — I10 Essential (primary) hypertension: Secondary | ICD-10-CM | POA: Diagnosis not present

## 2017-08-02 DIAGNOSIS — W19XXXA Unspecified fall, initial encounter: Secondary | ICD-10-CM | POA: Diagnosis not present

## 2017-08-02 DIAGNOSIS — R52 Pain, unspecified: Secondary | ICD-10-CM | POA: Diagnosis not present

## 2020-01-24 ENCOUNTER — Other Ambulatory Visit: Payer: Self-pay | Admitting: Internal Medicine

## 2020-01-24 DIAGNOSIS — R7989 Other specified abnormal findings of blood chemistry: Secondary | ICD-10-CM

## 2021-10-05 ENCOUNTER — Encounter: Payer: Self-pay | Admitting: Family Medicine
